# Patient Record
Sex: Female | Born: 1970
Health system: Southern US, Community
[De-identification: ages and names within clinical notes are randomized; demographics above are authoritative.]

## PROBLEM LIST (undated history)

## (undated) DIAGNOSIS — I10 Essential (primary) hypertension: Secondary | ICD-10-CM

## (undated) DIAGNOSIS — O24419 Gestational diabetes mellitus in pregnancy, unspecified control: Secondary | ICD-10-CM

## (undated) HISTORY — DX: Gestational diabetes mellitus in pregnancy, unspecified control: O24.419

## (undated) HISTORY — PX: TUBAL LIGATION: SHX77

---

## 2004-05-16 ENCOUNTER — Emergency Department (HOSPITAL_COMMUNITY): Admission: EM | Admit: 2004-05-16 | Discharge: 2004-05-17 | Payer: Self-pay

## 2005-12-14 ENCOUNTER — Emergency Department (HOSPITAL_COMMUNITY): Admission: EM | Admit: 2005-12-14 | Discharge: 2005-12-14 | Payer: Self-pay | Admitting: Emergency Medicine

## 2005-12-15 ENCOUNTER — Ambulatory Visit: Payer: Self-pay | Admitting: Nurse Practitioner

## 2005-12-15 ENCOUNTER — Ambulatory Visit: Payer: Self-pay | Admitting: *Deleted

## 2005-12-22 ENCOUNTER — Ambulatory Visit: Payer: Self-pay | Admitting: Nurse Practitioner

## 2005-12-26 ENCOUNTER — Ambulatory Visit (HOSPITAL_COMMUNITY): Admission: RE | Admit: 2005-12-26 | Discharge: 2005-12-26 | Payer: Self-pay | Admitting: Family Medicine

## 2005-12-26 ENCOUNTER — Encounter (INDEPENDENT_AMBULATORY_CARE_PROVIDER_SITE_OTHER): Payer: Self-pay | Admitting: *Deleted

## 2006-01-05 ENCOUNTER — Ambulatory Visit: Payer: Self-pay | Admitting: Nurse Practitioner

## 2006-01-05 ENCOUNTER — Ambulatory Visit: Payer: Self-pay | Admitting: Hematology & Oncology

## 2006-07-03 ENCOUNTER — Ambulatory Visit: Payer: Self-pay | Admitting: Nurse Practitioner

## 2006-07-16 ENCOUNTER — Encounter (INDEPENDENT_AMBULATORY_CARE_PROVIDER_SITE_OTHER): Payer: Self-pay | Admitting: Nurse Practitioner

## 2006-07-16 ENCOUNTER — Ambulatory Visit: Payer: Self-pay | Admitting: Nurse Practitioner

## 2006-09-19 ENCOUNTER — Ambulatory Visit: Payer: Self-pay | Admitting: Nurse Practitioner

## 2007-04-17 ENCOUNTER — Encounter (INDEPENDENT_AMBULATORY_CARE_PROVIDER_SITE_OTHER): Payer: Self-pay | Admitting: *Deleted

## 2007-04-23 ENCOUNTER — Ambulatory Visit: Payer: Self-pay | Admitting: Internal Medicine

## 2007-08-01 DIAGNOSIS — O24419 Gestational diabetes mellitus in pregnancy, unspecified control: Secondary | ICD-10-CM

## 2007-08-01 HISTORY — PX: TUBAL LIGATION: SHX77

## 2007-08-01 HISTORY — DX: Gestational diabetes mellitus in pregnancy, unspecified control: O24.419

## 2007-11-07 ENCOUNTER — Emergency Department (HOSPITAL_COMMUNITY): Admission: EM | Admit: 2007-11-07 | Discharge: 2007-11-07 | Payer: Self-pay | Admitting: Emergency Medicine

## 2008-02-14 ENCOUNTER — Ambulatory Visit (HOSPITAL_COMMUNITY): Admission: RE | Admit: 2008-02-14 | Discharge: 2008-02-14 | Payer: Self-pay | Admitting: Gynecology

## 2008-02-18 ENCOUNTER — Ambulatory Visit (HOSPITAL_COMMUNITY): Admission: RE | Admit: 2008-02-18 | Discharge: 2008-02-18 | Payer: Self-pay | Admitting: Family Medicine

## 2008-02-20 ENCOUNTER — Ambulatory Visit: Payer: Self-pay | Admitting: *Deleted

## 2008-02-24 ENCOUNTER — Ambulatory Visit: Payer: Self-pay | Admitting: Obstetrics & Gynecology

## 2008-02-28 ENCOUNTER — Ambulatory Visit (HOSPITAL_COMMUNITY): Admission: RE | Admit: 2008-02-28 | Discharge: 2008-02-28 | Payer: Self-pay | Admitting: Family Medicine

## 2008-03-02 ENCOUNTER — Ambulatory Visit: Payer: Self-pay | Admitting: Family Medicine

## 2008-03-09 ENCOUNTER — Encounter: Admission: RE | Admit: 2008-03-09 | Discharge: 2008-04-21 | Payer: Self-pay | Admitting: Obstetrics & Gynecology

## 2008-03-09 ENCOUNTER — Ambulatory Visit: Payer: Self-pay | Admitting: Obstetrics & Gynecology

## 2008-03-16 ENCOUNTER — Ambulatory Visit (HOSPITAL_COMMUNITY): Admission: RE | Admit: 2008-03-16 | Discharge: 2008-03-16 | Payer: Self-pay | Admitting: Family Medicine

## 2008-03-16 ENCOUNTER — Ambulatory Visit: Payer: Self-pay | Admitting: Obstetrics & Gynecology

## 2008-03-23 ENCOUNTER — Ambulatory Visit: Payer: Self-pay | Admitting: Obstetrics & Gynecology

## 2008-03-30 ENCOUNTER — Ambulatory Visit: Payer: Self-pay | Admitting: Obstetrics & Gynecology

## 2008-03-30 ENCOUNTER — Ambulatory Visit (HOSPITAL_COMMUNITY): Admission: RE | Admit: 2008-03-30 | Discharge: 2008-03-30 | Payer: Self-pay | Admitting: Family Medicine

## 2008-04-13 ENCOUNTER — Ambulatory Visit (HOSPITAL_COMMUNITY): Admission: RE | Admit: 2008-04-13 | Discharge: 2008-04-13 | Payer: Self-pay | Admitting: Family Medicine

## 2008-04-13 ENCOUNTER — Ambulatory Visit: Payer: Self-pay | Admitting: Obstetrics & Gynecology

## 2008-04-20 ENCOUNTER — Ambulatory Visit: Payer: Self-pay | Admitting: Obstetrics & Gynecology

## 2008-04-27 ENCOUNTER — Ambulatory Visit: Payer: Self-pay | Admitting: Family Medicine

## 2008-04-27 ENCOUNTER — Ambulatory Visit (HOSPITAL_COMMUNITY): Admission: RE | Admit: 2008-04-27 | Discharge: 2008-04-27 | Payer: Self-pay | Admitting: Family Medicine

## 2008-05-04 ENCOUNTER — Ambulatory Visit: Payer: Self-pay | Admitting: Obstetrics & Gynecology

## 2008-05-04 ENCOUNTER — Ambulatory Visit (HOSPITAL_COMMUNITY): Admission: RE | Admit: 2008-05-04 | Discharge: 2008-05-04 | Payer: Self-pay | Admitting: Family Medicine

## 2008-05-11 ENCOUNTER — Ambulatory Visit: Payer: Self-pay | Admitting: Obstetrics & Gynecology

## 2008-05-11 ENCOUNTER — Encounter: Admission: RE | Admit: 2008-05-11 | Discharge: 2008-05-11 | Payer: Self-pay | Admitting: Obstetrics & Gynecology

## 2008-05-18 ENCOUNTER — Ambulatory Visit (HOSPITAL_COMMUNITY): Admission: RE | Admit: 2008-05-18 | Discharge: 2008-05-18 | Payer: Self-pay | Admitting: Family Medicine

## 2008-05-18 ENCOUNTER — Ambulatory Visit: Payer: Self-pay | Admitting: Family Medicine

## 2008-05-25 ENCOUNTER — Ambulatory Visit: Payer: Self-pay | Admitting: Family Medicine

## 2008-05-25 ENCOUNTER — Ambulatory Visit: Payer: Self-pay | Admitting: Obstetrics & Gynecology

## 2008-05-25 ENCOUNTER — Encounter: Payer: Self-pay | Admitting: Obstetrics & Gynecology

## 2008-05-25 ENCOUNTER — Inpatient Hospital Stay (HOSPITAL_COMMUNITY): Admission: AD | Admit: 2008-05-25 | Discharge: 2008-05-27 | Payer: Self-pay | Admitting: Family Medicine

## 2008-05-25 LAB — CONVERTED CEMR LAB
HCT: 33 % — ABNORMAL LOW (ref 36.0–46.0)
Platelets: 145 10*3/uL — ABNORMAL LOW (ref 150–400)
RDW: 13.6 % (ref 11.5–15.5)
WBC: 8.4 10*3/uL (ref 4.0–10.5)

## 2008-06-01 ENCOUNTER — Ambulatory Visit: Payer: Self-pay | Admitting: Obstetrics & Gynecology

## 2008-06-01 ENCOUNTER — Encounter: Payer: Self-pay | Admitting: *Deleted

## 2008-06-01 ENCOUNTER — Inpatient Hospital Stay (HOSPITAL_COMMUNITY): Admission: AD | Admit: 2008-06-01 | Discharge: 2008-06-05 | Payer: Self-pay | Admitting: Family Medicine

## 2008-06-02 ENCOUNTER — Encounter: Payer: Self-pay | Admitting: *Deleted

## 2008-06-02 ENCOUNTER — Encounter: Payer: Self-pay | Admitting: Obstetrics & Gynecology

## 2009-08-27 ENCOUNTER — Emergency Department (HOSPITAL_COMMUNITY): Admission: EM | Admit: 2009-08-27 | Discharge: 2009-08-27 | Payer: Self-pay | Admitting: Emergency Medicine

## 2009-10-03 ENCOUNTER — Emergency Department (HOSPITAL_COMMUNITY): Admission: EM | Admit: 2009-10-03 | Discharge: 2009-10-03 | Payer: Self-pay | Admitting: Emergency Medicine

## 2009-11-19 ENCOUNTER — Encounter: Admission: RE | Admit: 2009-11-19 | Discharge: 2009-11-19 | Payer: Self-pay | Admitting: Family Medicine

## 2010-06-19 IMAGING — US US OB DETAIL EACH ADDL GEST + 14 WK
1 series · 14 of 28 positions shown · non-contrast
Comparison: none

OBSTETRICAL ULTRASOUND:
 This ultrasound was performed in The [HOSPITAL], and the [facility] report will be stored to [REDACTED] PACS.

[Series 1: us ob detail each addl gest + 14 wk · 14 of 199 slices shown]
[im 8/199]
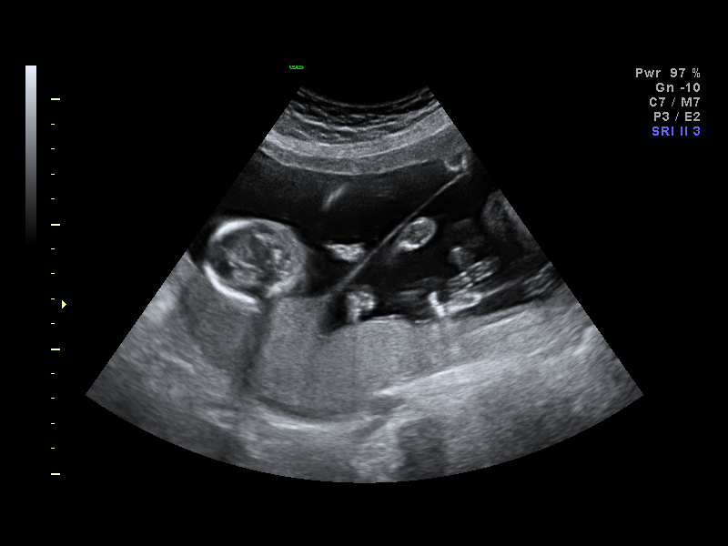
[im 23/199]
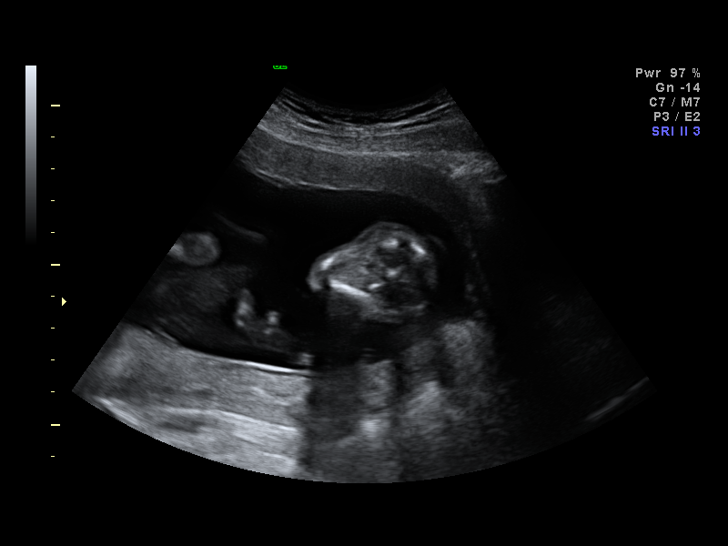
[im 37/199]
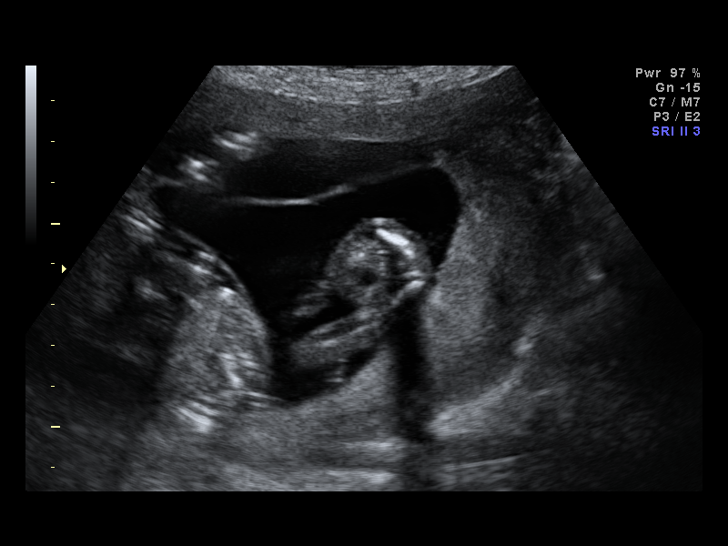
[im 52/199]
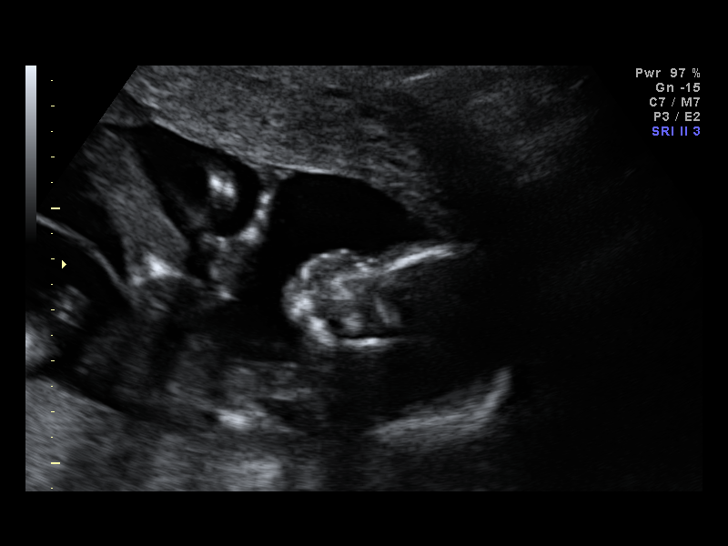
[im 67/199]
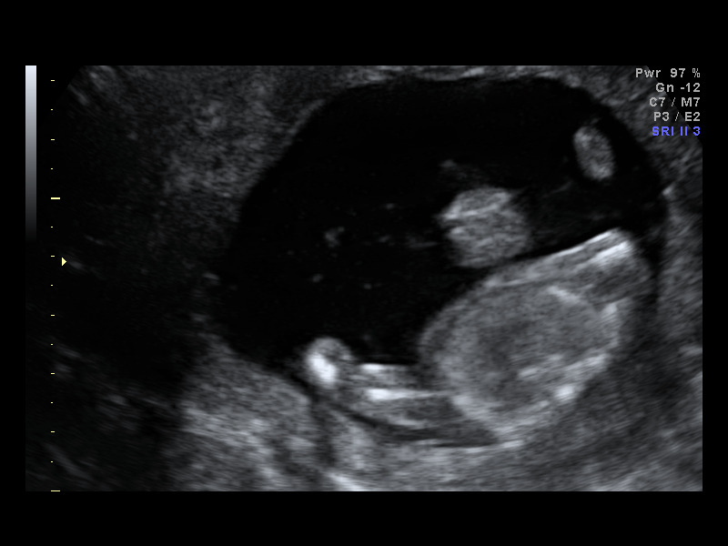
[im 81/199]
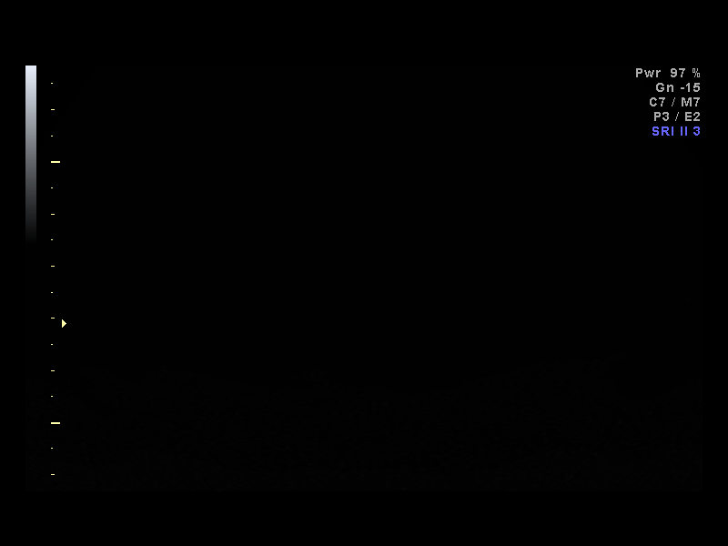
[im 96/199]
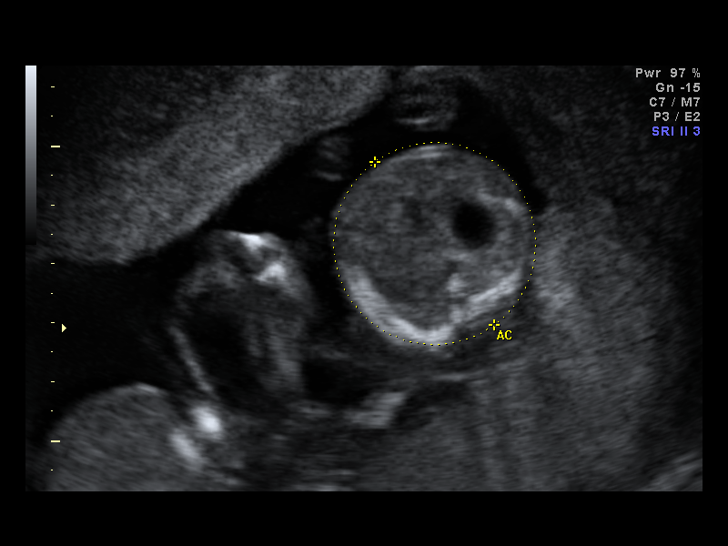
[im 111/199]
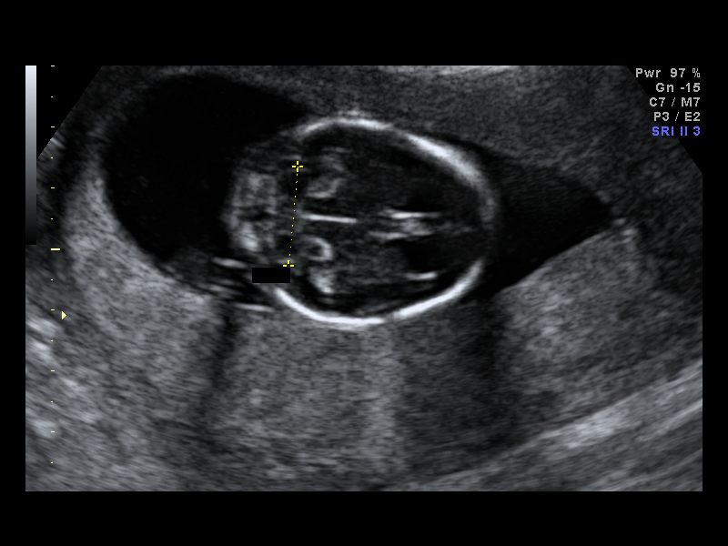
[im 125/199]
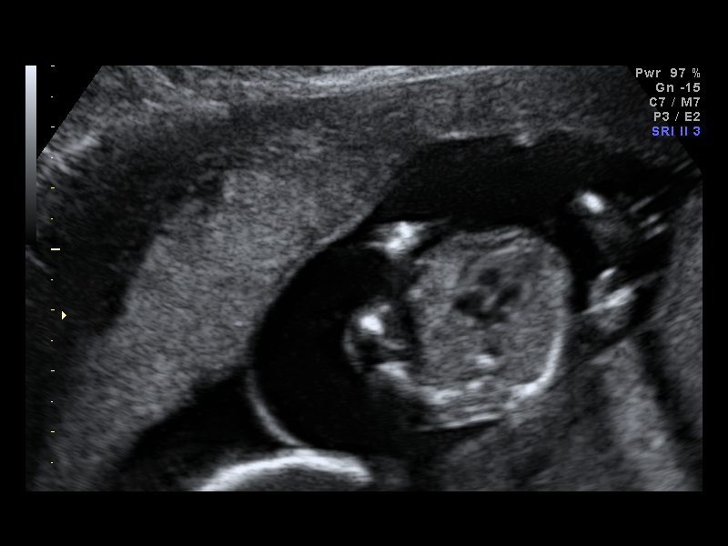
[im 140/199]
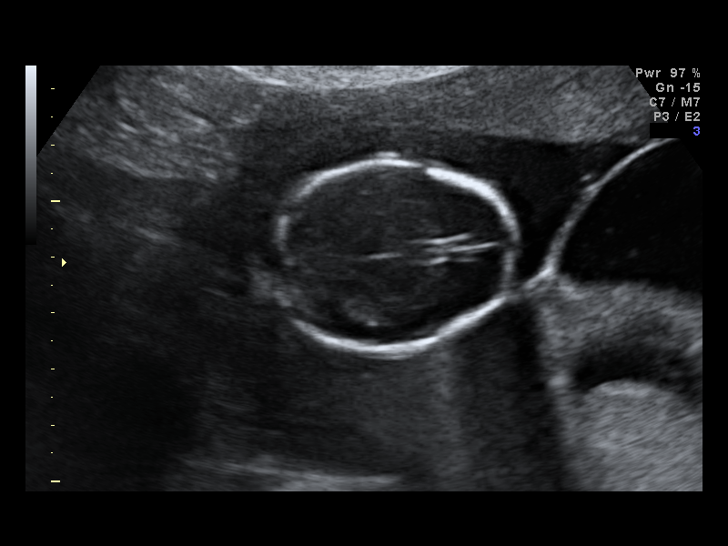
[im 155/199]
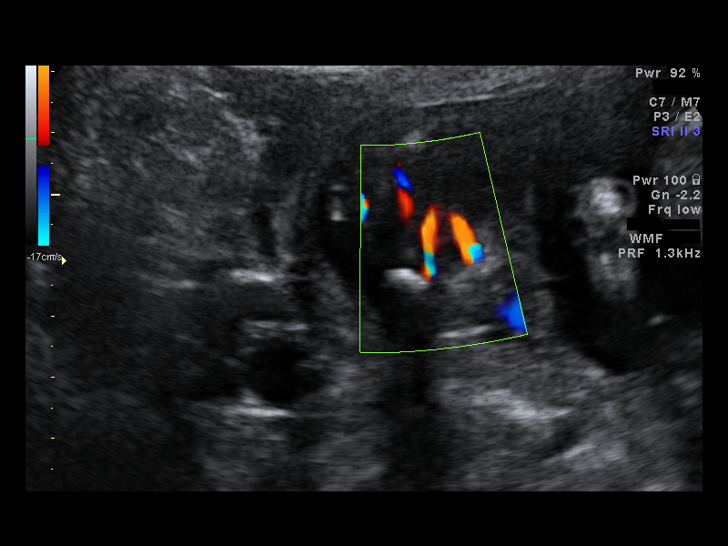
[im 169/199]
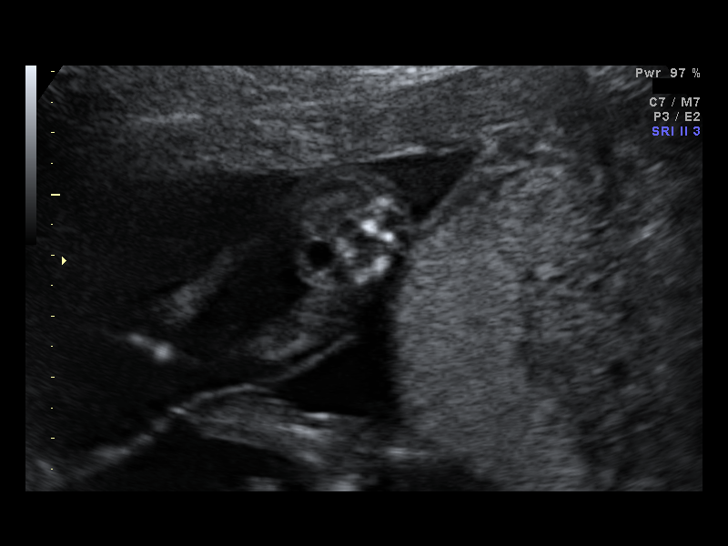
[im 184/199]
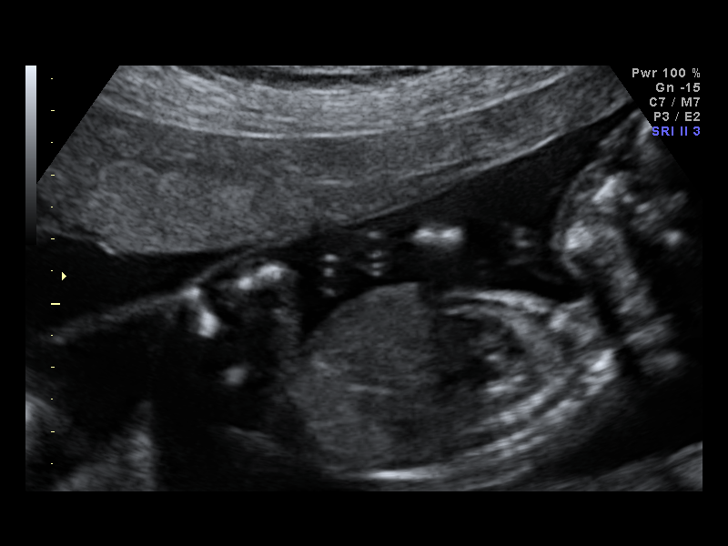
[im 199/199]
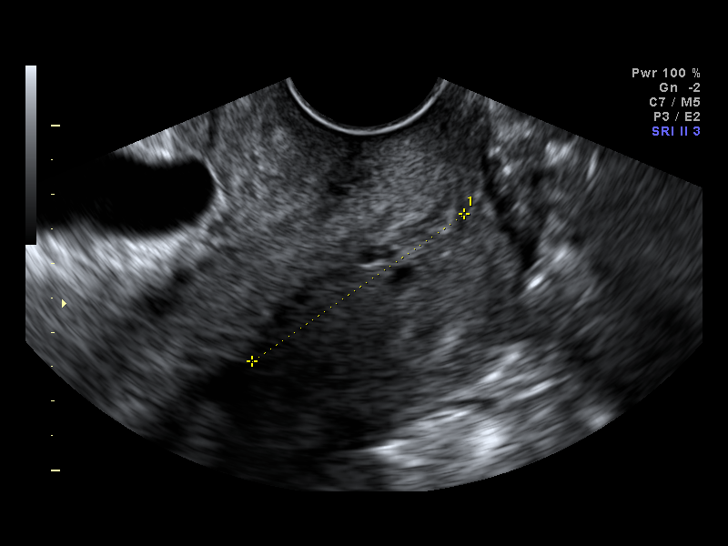

[14 of 28 positions shown; findings below may reference images not displayed]

IMPRESSION: [facility] has also been faxed to the ordering physician.

## 2010-07-06 IMAGING — US US OB FOLLOW-UP EACH ADDL GEST (MODIFY)
1 series · 14 of 28 positions shown · non-contrast
Comparison: none

OBSTETRICAL ULTRASOUND:
 This ultrasound was performed in The [facility]OS[facility]], and the [facility] report will be stored to [REDACTED] PACS.

[Series 1: us ob follow-up each addl gest (modify) · 14 of 166 slices shown]
[im 7/166]
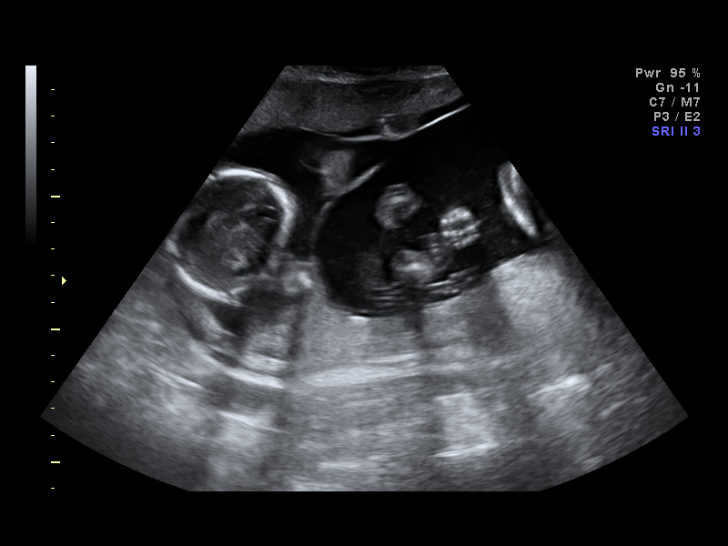
[im 19/166]
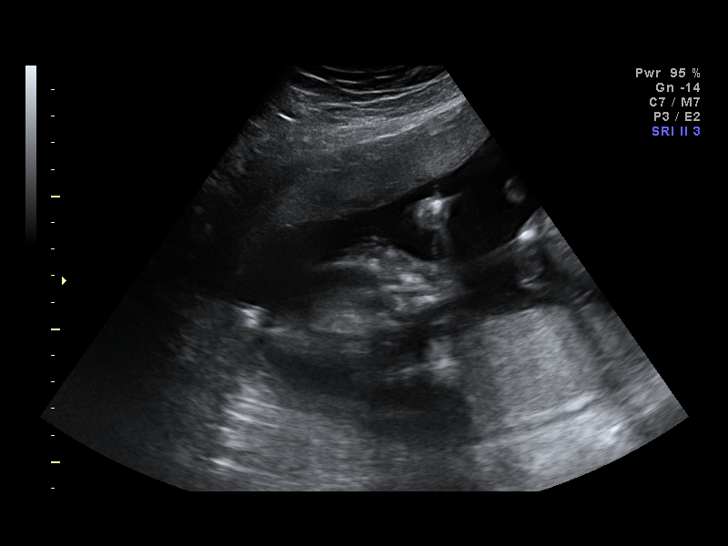
[im 31/166]
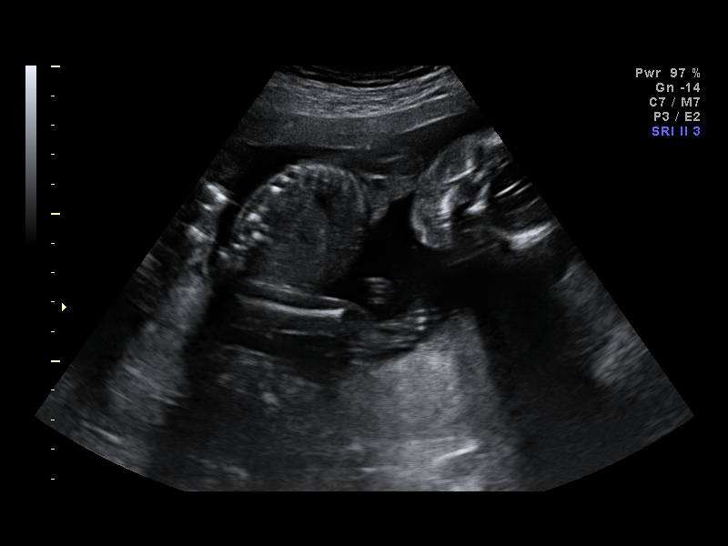
[im 43/166]
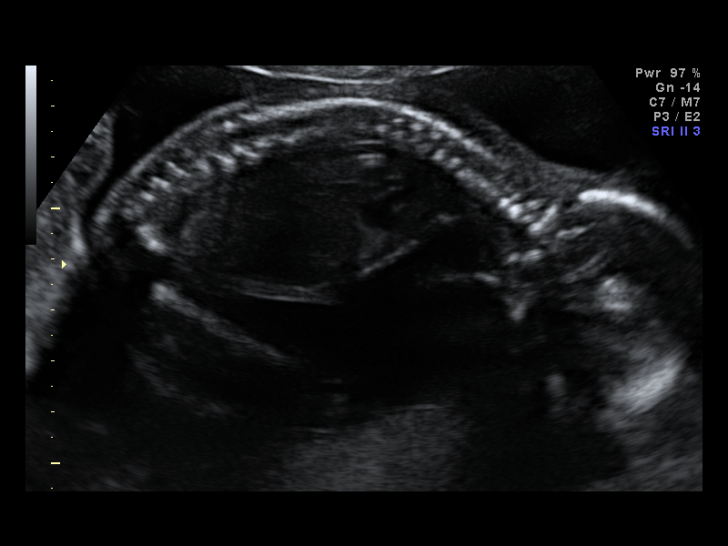
[im 56/166]
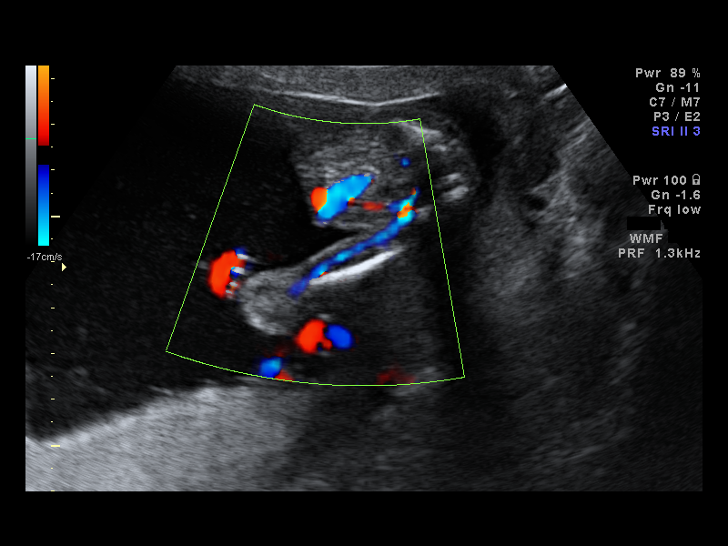
[im 68/166]
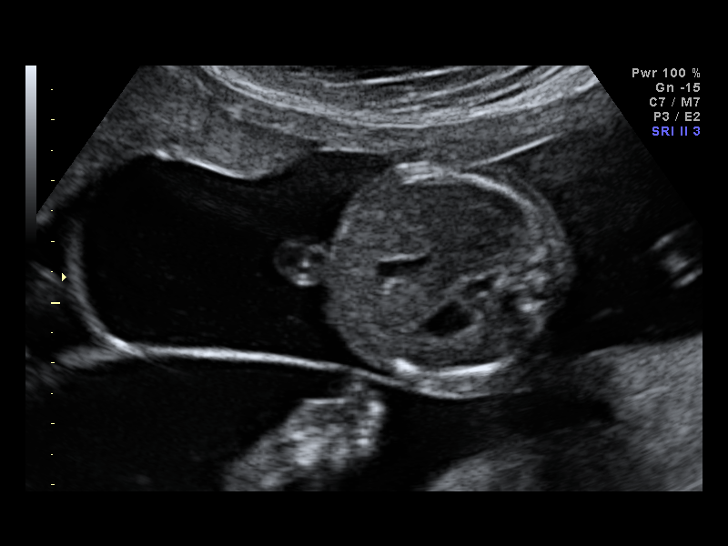
[im 80/166]
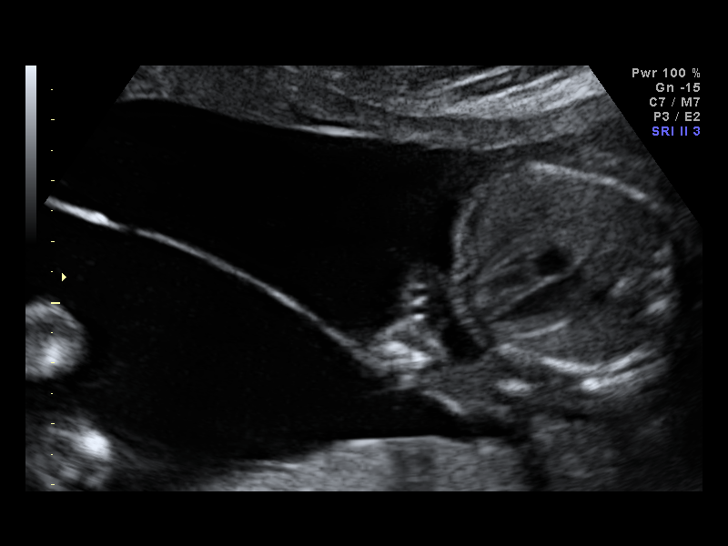
[im 92/166]
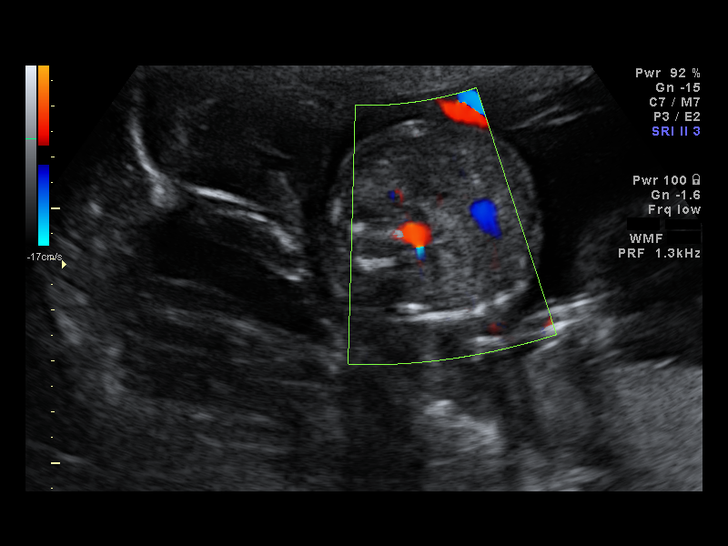
[im 104/166]
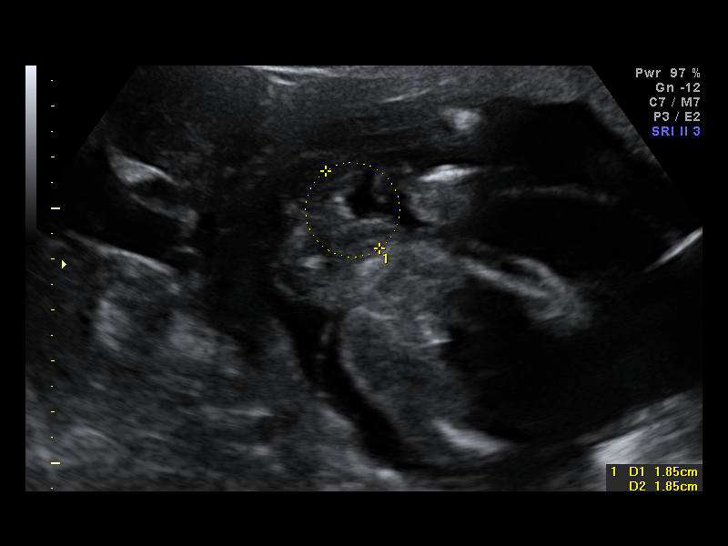
[im 117/166]
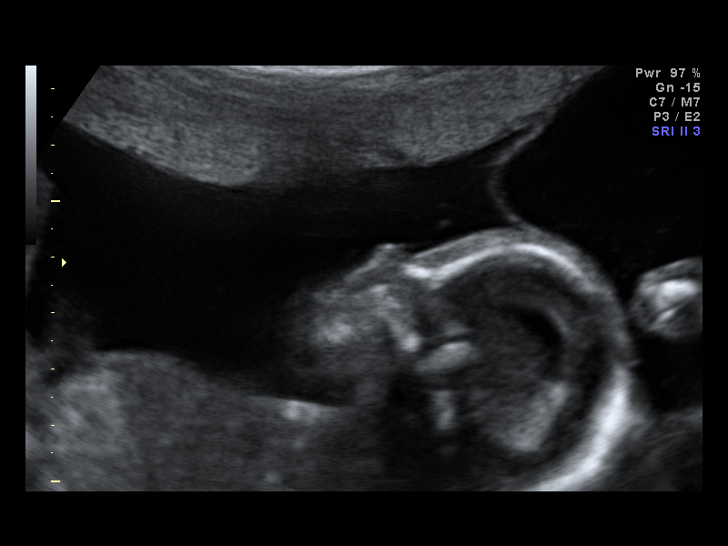
[im 129/166]
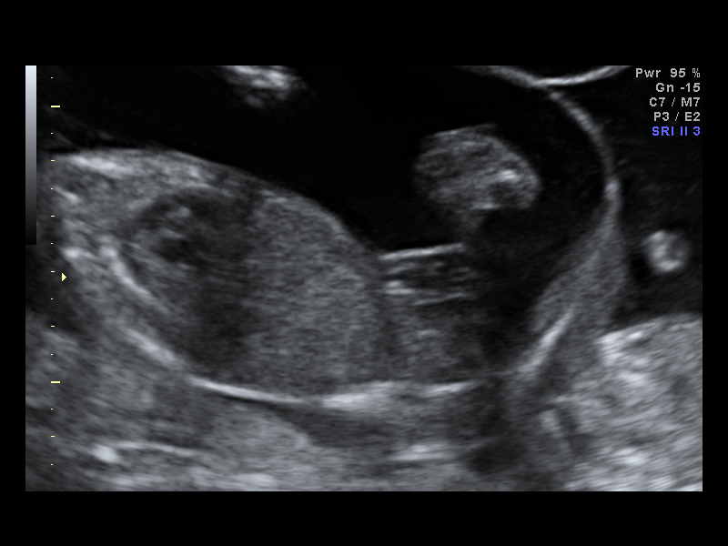
[im 141/166]
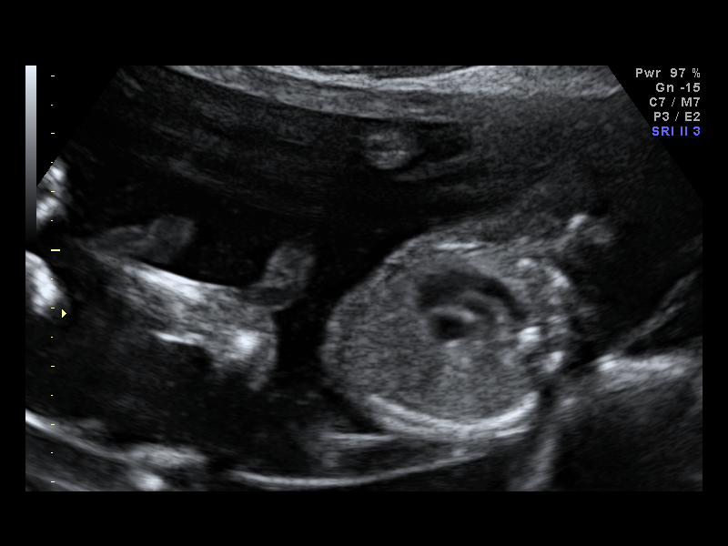
[im 153/166]
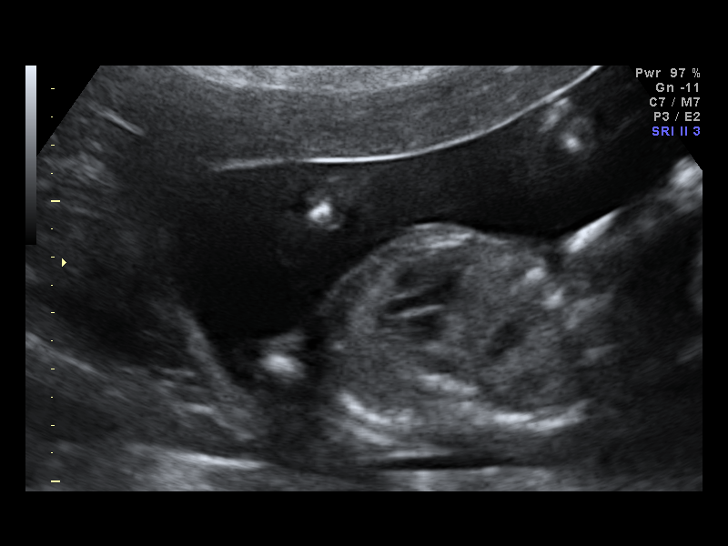
[im 166/166]
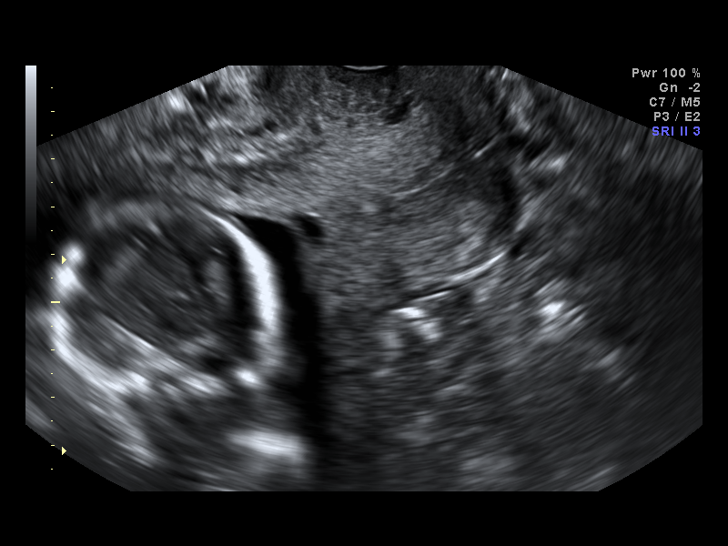

[14 of 28 positions shown; findings below may reference images not displayed]

IMPRESSION: [facility] has also been faxed to the ordering physician.

## 2010-08-03 IMAGING — US US OB FOLLOW-UP
1 series · 14 of 28 positions shown · non-contrast
Comparison: none

OBSTETRICAL ULTRASOUND:
 This ultrasound was performed in [facility]], and the [facility] report will be stored to [REDACTED] PACS.

[Series 1: us ob follow-up · 14 of 98 slices shown]
[im 4/98]
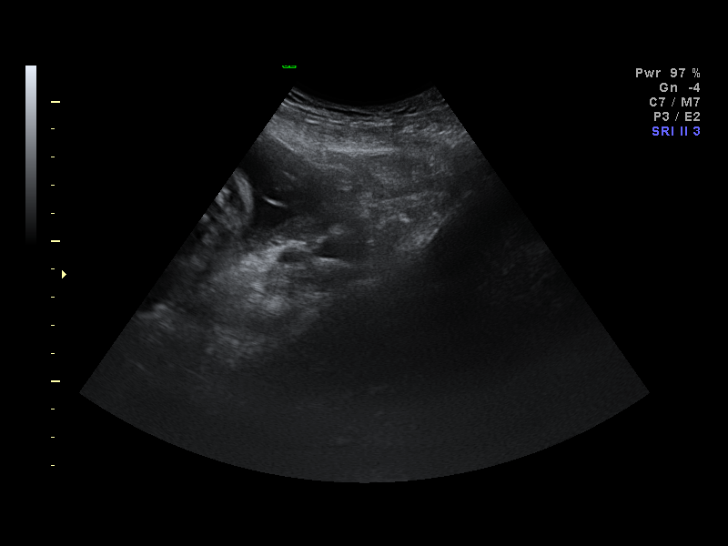
[im 11/98]
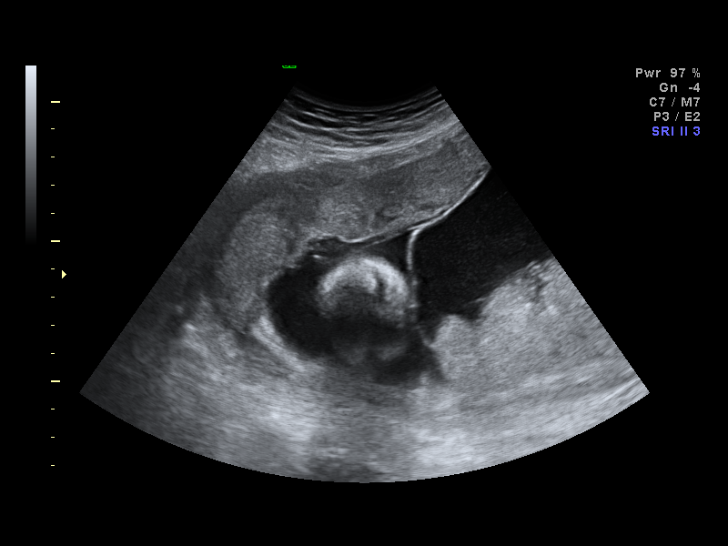
[im 18/98]
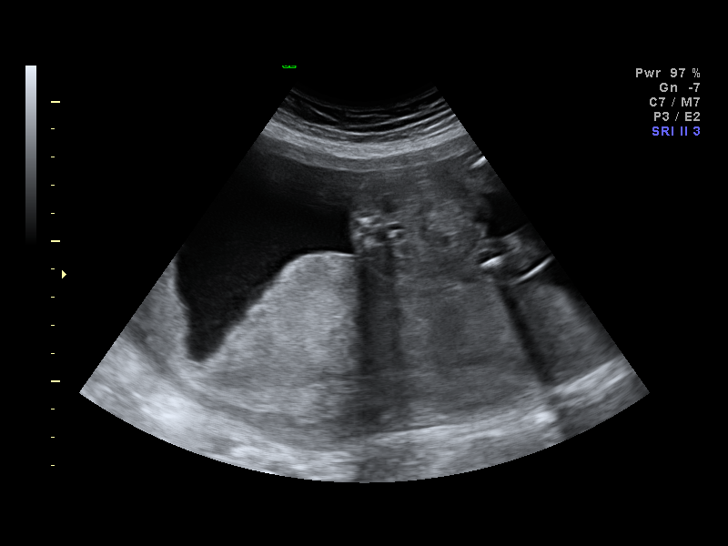
[im 26/98]
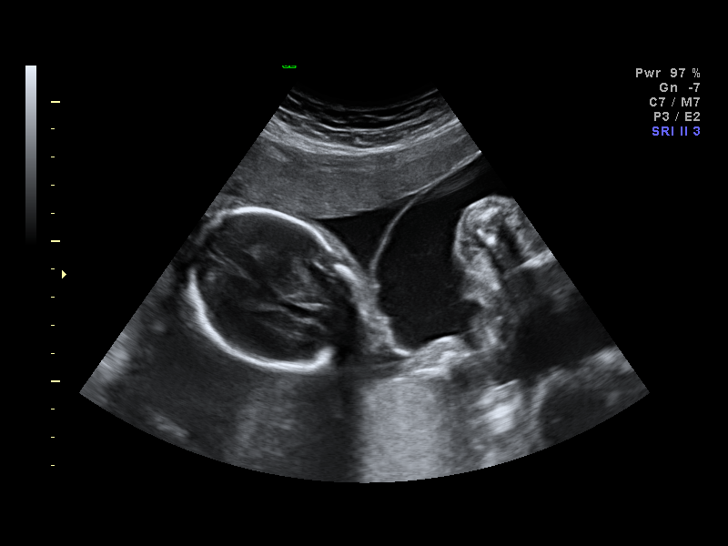
[im 33/98]
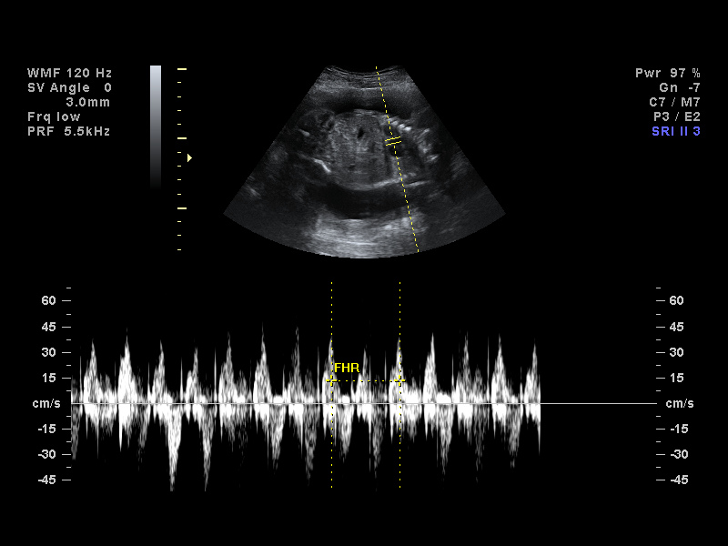
[im 40/98]
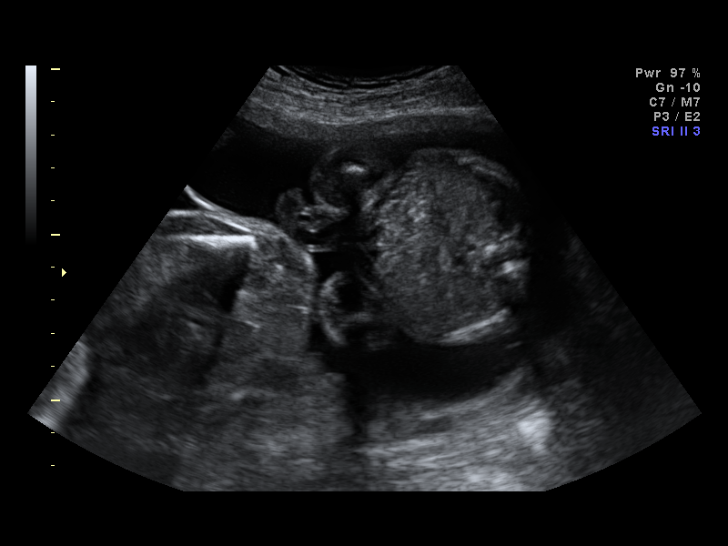
[im 47/98]
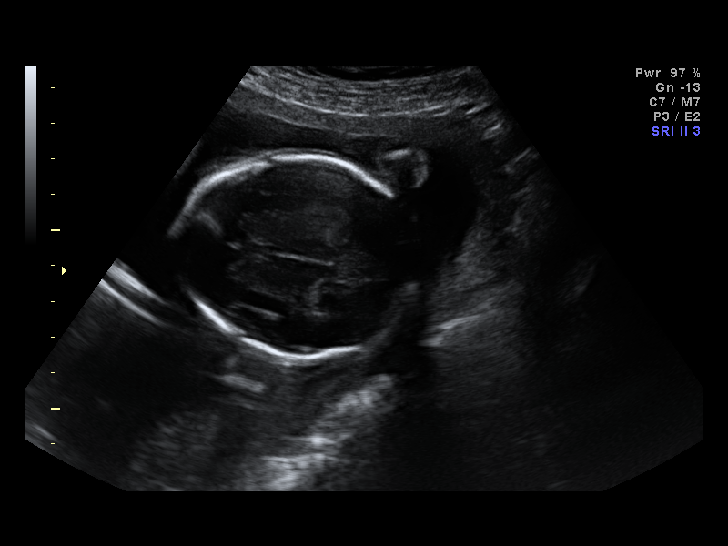
[im 54/98]
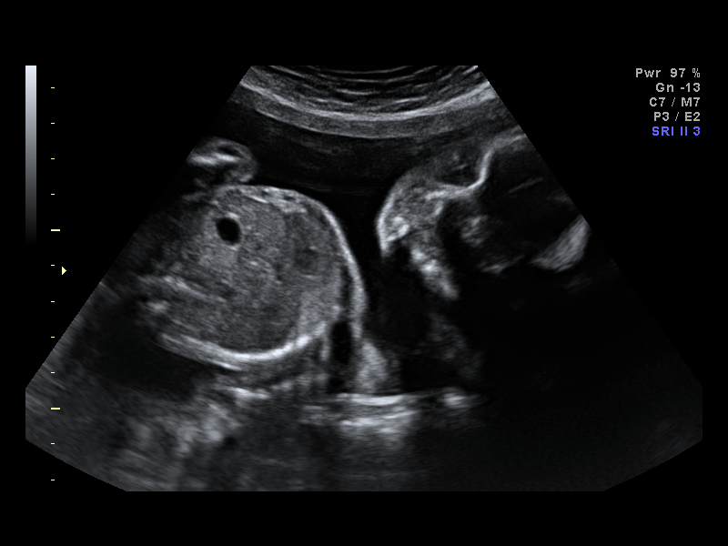
[im 62/98]
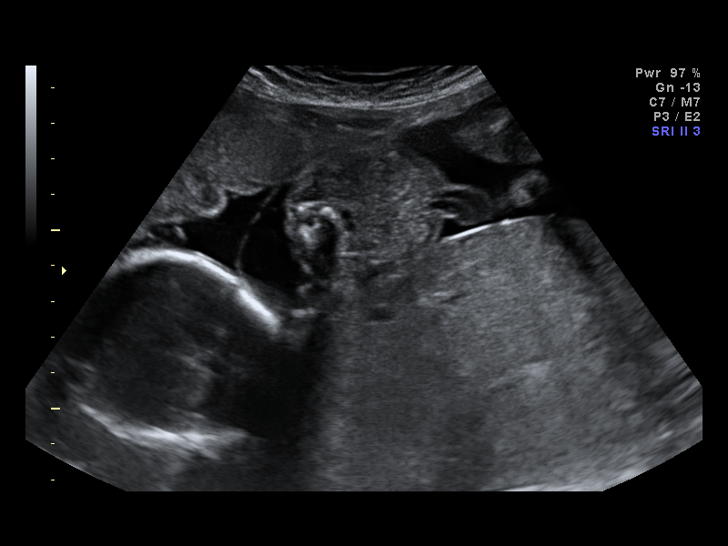
[im 69/98]
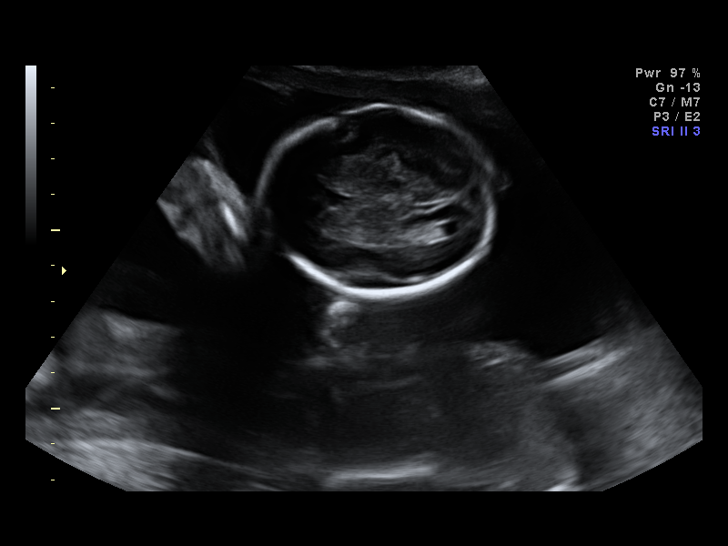
[im 76/98]
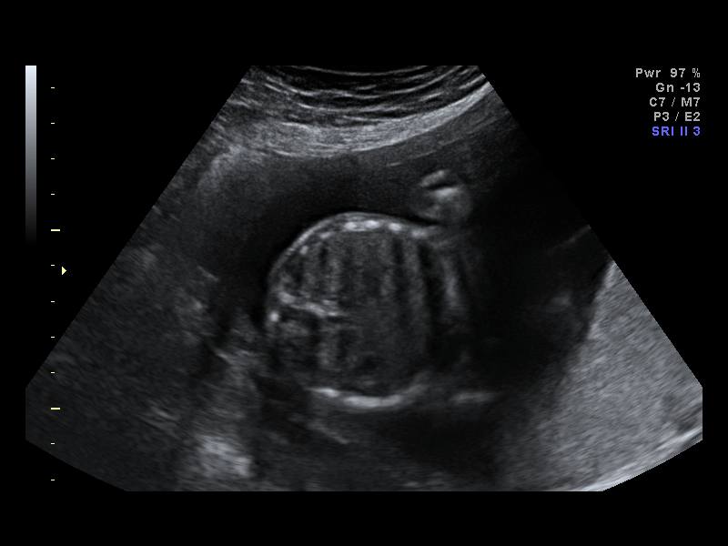
[im 83/98]
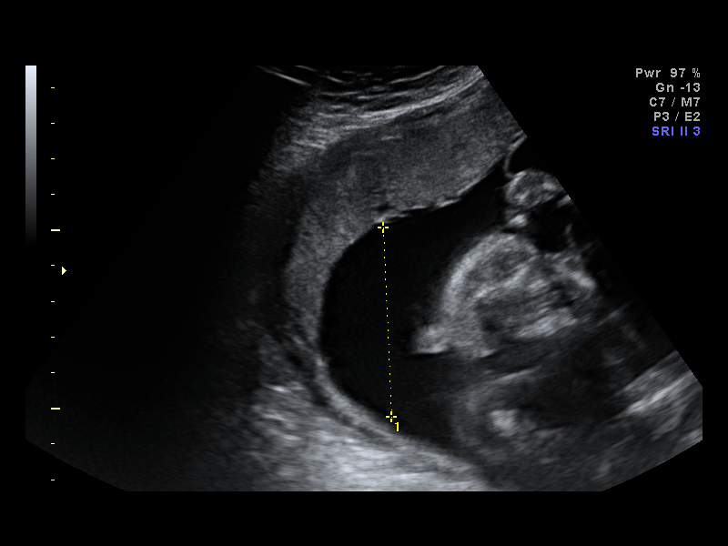
[im 90/98]
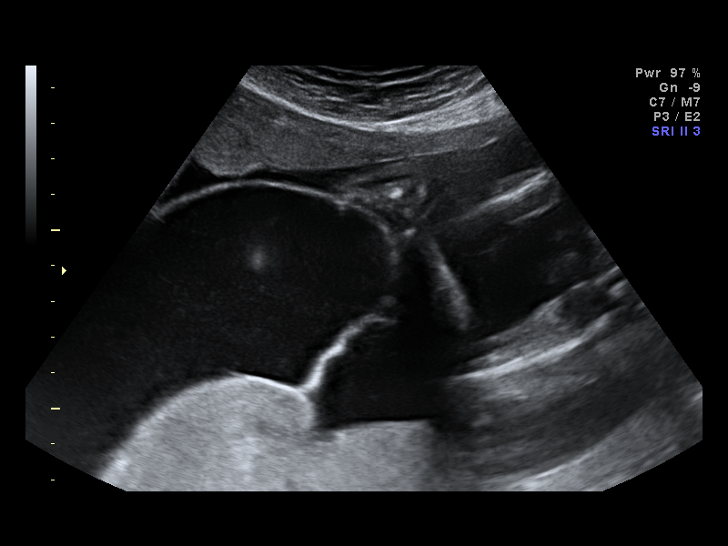
[im 98/98]
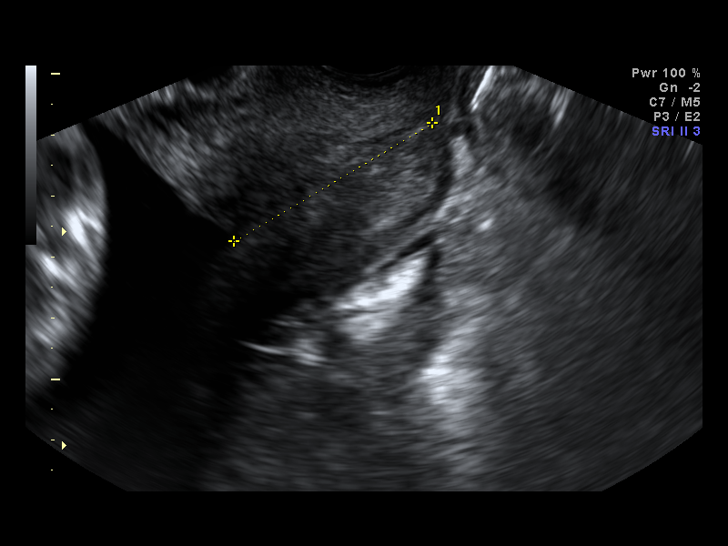

[14 of 28 positions shown; findings below may reference images not displayed]

IMPRESSION: [facility] has also been faxed to the ordering physician.

## 2010-08-21 ENCOUNTER — Encounter: Payer: Self-pay | Admitting: *Deleted

## 2010-10-24 LAB — BASIC METABOLIC PANEL
CO2: 27 mEq/L (ref 19–32)
Calcium: 9.1 mg/dL (ref 8.4–10.5)
Creatinine, Ser: 0.71 mg/dL (ref 0.4–1.2)
GFR calc Af Amer: >60 mL/min (ref >60)
GFR calc non Af Amer: >60 mL/min (ref >60)
Glucose, Bld: 121 mg/dL — ABNORMAL HIGH (ref 70–99)
Sodium: 137 mEq/L (ref 135–145)

## 2010-10-24 LAB — DIFFERENTIAL
Basophils Absolute: 0 10*3/uL (ref 0.0–0.1)
Eosinophils Absolute: 0 10*3/uL (ref 0.0–0.7)
Lymphocytes Relative: 29 % (ref 12–46)
Monocytes Relative: 7 % (ref 3–12)
Neutro Abs: 2.8 10*3/uL (ref 1.7–7.7)
Neutrophils Relative %: 63 % (ref 43–77)

## 2010-10-24 LAB — CBC
Hemoglobin: 12.9 g/dL (ref 12.0–15.0)
MCHC: 34.1 g/dL (ref 30.0–36.0)
RBC: 4.28 MIL/uL (ref 3.87–5.11)
RDW: 13.6 % (ref 11.5–15.5)

## 2010-12-13 NOTE — Op Note (Signed)
Carrie Greer, WALDER         ACCOUNT NO.:  [id number]   MEDICAL RECORD NO.:  [id number]          PATIENT TYPE:  INP   LOCATION:                                FAC[facility]:  [facility]   PHYSICIAN:  Lesly Dukes, M.D. DATE OF BIRTH:  06/04/1971   DATE OF PROCEDURE:  06/02/2008  DATE OF DISCHARGE:                               OPERATIVE REPORT   PREOPERATIVE DIAGNOSES:  30. A 40 year old para 3-2-1-4 at 29 weeks and 5 days estimated      gestational age with reverse end-diastolic flow of baby B of her      set of triplets.  2. Undesired fertility.   POSTOPERATIVE DIAGNOSES:  60. A 40 year old para 3-2-1-4 at 29 weeks and 5 days estimated      gestational age with reverse end-diastolic flow of baby B of her      set of triplets.  2. Undesired fertility.   PROCEDURE:  Primary low-transverse cesarean section plus bilateral tubal  ligation with Filshie clips.   SURGEON:  Lesly Dukes, MD   ASSISTANT:  Odie Sera, DO   ANESTHESIA:  Spinal.   FINDINGS:  Grossly normal uterus, ovaries, and fallopian tubes  bilaterally.  One large adhesion of the peritoneum bladder flap to the  anterior portion of the uterus.  Baby A was breech, 2 pounds 14 ounces,  Apgars 8 at one minute and 9 at five minutes, and arterial cord gas was  7.35.  Baby B was born breech, 1 pound 14 ounces, 6 at one minute and 8  at five minutes, arterial cord gas was 7.29.  Baby C was 2 pounds 9  ounces, Apgars 9 at one minute and 9 at five minutes with arterial cord  gas of 7.35.   SPECIMENS:  Placenta to pathology.   ESTIMATED BLOOD LOSS:  1000.   COMPLICATIONS:  Mild atony relieved with Cytotec and Pitocin.   PROCEDURE:  After informed consent was obtained, the patient taken to  the operating room where spinal anesthesia was found to be adequate.  The patient was placed in dorsal supine position with leftward tilt and  a Foley was placed in the bladder.  Pfannenstiel skin incision was made  with the  scalpel and carried down to the fascia.  The fascia was incised  in the midline.  This incision was extended bilaterally with the Mayo  scissors.  The superior and inferior aspects of the fascial incision  were grasped with a Kocher clamp, tented up, and dissected off sharply  and bluntly.  The underlying layers of rectus muscles were separated in  midline.  The peritoneum was entered sharply with this incision extended  with good visualization of bladder.  The one large adhesion that was  described above was taken down with the Bovie.  There was good  hemostasis.  An Alexis retractor was placed into the abdomen and the  bladder flap was then created.  The bladder blade was inserted.  The  uterine incision was made in a transverse fashion in the lower uterine  segment.  This incision was extended bilaterally and cephaledly bluntly.  Sac of baby  A was ruptured and the baby A was delivered breech with the  above findings.  The sac of baby B was then ruptured and delivered  breech with the above findings.  Baby C sac was then ruptured.  Baby was  delivered breech with the above findings.  The cord gases and cord blood  were sent in all three cords.  The placenta was delivered manually and  sent to pathology.  The uterus was clear of all clots and debris, and  there was noted to be some atony.  Pitocin was started immediately;  however, the atony was still significant enough to require Cytotec per  rectum.  This was placed by one of the OR nurses.  The uterine incision  was then closed with 0 Vicryl in a running locked fashion with good  hemostasis.  A second suture of 0 Vicryl was used to reinforce the  incision and aid in hemostasis.  The uterine tone was good at this  point.  The tubal was then done.  Each fallopian tube was followed out  to its fimbriated end and a Filshie clip was placed transversely across  each fallopian tube.  Uterine hemostasis was assured one last time.  The   peritoneum and rectus muscles were noted to be hemostatic.  The fascia  was closed with 0 Vicryl in a running fashion with good hemostasis.  The  subcutaneous tissue was copiously irrigated and found to be hemostatic.  The skin was closed with staples.  The patient tolerated the procedure  well.  Sponge, lap, instrument, and needle count were correct x2 and the  patient recovered in stable condition.      Lesly Dukes, M.D.  Electronically Signed     KHL/MEDQ  D:  06/02/2008  T:  06/03/2008  Job:  [id number]

## 2010-12-13 NOTE — Discharge Summary (Signed)
Carrie Greer, Carrie Greer         ACCOUNT NO.:  [id number]   MEDICAL RECORD NO.:  [id number]         PATIENT TYPE:  WINP   LOCATION:                                [facility]   PHYSICIAN:  Norton Blizzard, MD    DATE OF BIRTH:  [birth year]   DATE OF ADMISSION:  06/01/2008  DATE OF DISCHARGE:                               DISCHARGE SUMMARY   ADMISSION DIAGNOSES:  Triplet gestation, 29-4/7th weeks', chronic  hypertension, pregestational diabetes, probable class C, fetal growth  lag in baby B, persistent absent and end-diastolic flow in baby B.   DISCHARGE DIAGNOSES:  Primary low transverse cervical section with  bilateral tubal ligation with Filshie clip type, viable preterm  triplets, 29-5/7th weeks' gestation, fetal growth lag, and first end-  diastolic flow, chronic hypertension, pregestational diabetes, mild  uterine atony, and advanced maternal age.   REASON FOR ADMISSION:  Please see written H and P.  Essentially the  patient was sent for further evaluation after being seen at maternal  fetal medicine and baby B was noted to have absent end-diastolic flow on  Doppler testing.   HOSPITAL COURSE:  The patient remained normotensive on her labetalol.  Her CBGs were in fair control on first dose insulin.  Of note, she  received her betamethasone on previous admission.  Her daily Dopplers  and fetal monitoring were reassuring.  On first day that on June 02, 2008 in the evening, she was noted to have reversal of end-diastolic  flow for twin B.  In consultation with Dr. Penne Lash and maternal fetal  medicine, the decision was made to proceed with primary LTCS.  This was  essentially uncomplicated; both fetuses were viable with desirable range  cord pH.  She did have some mild atony which was managed with Cytotec.  Her postoperative course went well.  On postop day #3, she remained  afebrile.  Staples were to be removed on June 08, 2008.  Babies were  in the NICU.  Her blood  sugars were well-controlled on glyburide  postoperatively.  Her BPs were in the 120s/70s on labetalol 200 b.i.d.  On June 05, 2008, she was discharged home in good condition.   MEDICATIONS:  Prenatal vitamins as well as iron, since her hemoglobin  postoperatively was 9.5.  She was given prescriptions for Percocet 5/325  mg 1-2 p.o. q.4-6 h. p.r.n. for pain, Colace 100 mg daily p.r.n.,  ibuprofen 600 mg p.o. q.6 h.  Followup other than Baby Love check on  June 08, 2008 was to be at 6 weeks at [facility].  At that time,  her hypertension and diabetes would be reevaluated.  At the time of  discharge, she has also sent home on glyburide 5 mg p.o. daily and  labetalol 200 mg p.o. b.i.d.      Deirdre Christy Gentles, C.N.M.      Norton Blizzard, MD  Electronically Signed    DP/MEDQ  D:  07/28/2008  T:  07/29/2008  Job:  [id number]

## 2010-12-13 NOTE — Discharge Summary (Signed)
Carrie Greer, Carrie Greer         ACCOUNT NO.:  [id number]   MEDICAL RECORD NO.:  [id number]          PATIENT TYPE:  INP   LOCATION:  9159                          [facility]ACILITY:  [facility]   PHYSICIAN:  Tanya S. Shawnie Pons, M.D.   DATE OF BIRTH:  06-Jun-1971   DATE OF ADMISSION:  05/25/2008  DATE OF DISCHARGE:  05/27/2008                               DISCHARGE SUMMARY   REASON FOR ADMISSION:  [name] is a 40 year old, gravida 7, para  4-1-1-4, with a triplet gestation who was admitted at 28-4/7th weeks'  gestational age with preterm contractions and decreased cervical length.  The patient was seen in the [facility] and had an ultrasound  earlier that day that showed a shortened cervical length of 2.7.  She  was admitted for monitoring of corticosteroids as well as Dulcolax.   HOSPITAL COURSE:  When the patient arrived on the ward, she was noted to  be contracting approximately 4 times per hour.  The patient felt the  majority of those contractions, rated them as mild.  She was started on  Procardia 60 mg twice a day which over a period of 24 hours helped  decrease her retractions.  She also received 2 doses of betamethasone  during her hospitalization.  Her contraction pattern essentially ceased  during her hospitalization, and the baby's heart rates remained  reassuring during each examination.  Her other prenatal complications  include chronic hypertension which remained on stable her  hospitalization as well as insulin-dependant diabetes mellitus which of  course was worsened somewhat by her betamethasone shots.  We have  increased her insulin a little bit to help control this.  Otherwise, the  patient is in good condition.  At the time of discharge, she is not  feeling any contractions.  She denies any headaches or abdominal pain.  She had some occasional contraction on the monitor.  Again, the patient  is not sensing those at all.   FINAL DIAGNOSES:  1. Triplet gestation at  28-4/7th weeks.  2. Short cervix.  3. Preterm contractions.  4. Chronic hypertension.  5. Insulin-dependant diabetes mellitus.   PROCEDURE PERFORMED:  None.   DISCHARGE INSTRUCTIONS:  The patient will be given a prescription for  Procardia XL 60 mg twice a day.  Additionally, her insulin will be  changed to 14 units of regular and 24 units of NPH every morning, 18  units of regular before dinner and 15 units NPH before bedtime.  The  patient will continue to take her labetalol 200 mg twice  daily and her prenatal vitamins as well.  The patient voiced  understanding of these instructions, and her questions were answered.  She has a followup appointment in the [facility] as well as with  Maternal Fetal Medicine on Monday, May 31, 2008.       Odie Sera, DO  Electronically Signed     ______________________________  Shelbie Proctor. Shawnie Pons, M.D.    MC/MEDQ  D:  05/27/2008  T:  05/27/2008  Job:  [id number]

## 2010-12-15 ENCOUNTER — Other Ambulatory Visit: Payer: Self-pay | Admitting: Family Medicine

## 2010-12-15 DIAGNOSIS — Z1231 Encounter for screening mammogram for malignant neoplasm of breast: Secondary | ICD-10-CM

## 2010-12-22 ENCOUNTER — Ambulatory Visit
Admission: RE | Admit: 2010-12-22 | Discharge: 2010-12-22 | Disposition: A | Discharge status: Home / Self Care | Payer: Managed Care, Other (non HMO) | Source: Ambulatory Visit | Attending: Family Medicine | Admitting: Family Medicine

## 2010-12-22 DIAGNOSIS — Z1231 Encounter for screening mammogram for malignant neoplasm of breast: Secondary | ICD-10-CM

## 2011-04-28 LAB — POCT URINALYSIS DIP (DEVICE)
Bilirubin Urine: NEGATIVE
Bilirubin Urine: NEGATIVE
Bilirubin Urine: NEGATIVE
Glucose, UA: 500 — AB
Glucose, UA: 500 — AB
Glucose, UA: 100 — AB
Glucose, UA: NEGATIVE
Hgb urine dipstick: NEGATIVE
Hgb urine dipstick: NEGATIVE
Nitrite: NEGATIVE
Nitrite: NEGATIVE
Nitrite: NEGATIVE
Nitrite: NEGATIVE
Operator id: 148111
Operator id: 297281
Operator id: 194561
Operator id: 297281
Urobilinogen, UA: 0.2
Urobilinogen, UA: 0.2
Urobilinogen, UA: 0.2
Urobilinogen, UA: 0.2

## 2011-05-01 LAB — POCT URINALYSIS DIP (DEVICE)
Glucose, UA: NEGATIVE
Glucose, UA: NEGATIVE
Hgb urine dipstick: NEGATIVE
Hgb urine dipstick: NEGATIVE
Nitrite: NEGATIVE
Nitrite: NEGATIVE
Urobilinogen, UA: 0.2
Urobilinogen, UA: 0.2
pH: 6
pH: 6.5

## 2011-05-02 LAB — GLUCOSE, CAPILLARY
Glucose-Capillary: 132 — ABNORMAL HIGH
Glucose-Capillary: 133 — ABNORMAL HIGH
Glucose-Capillary: 139 — ABNORMAL HIGH
Glucose-Capillary: 144 — ABNORMAL HIGH
Glucose-Capillary: 150 — ABNORMAL HIGH
Glucose-Capillary: 173 — ABNORMAL HIGH
Glucose-Capillary: 185 — ABNORMAL HIGH
Glucose-Capillary: 38 — CL
Glucose-Capillary: 112 — ABNORMAL HIGH
Glucose-Capillary: 131 — ABNORMAL HIGH
Glucose-Capillary: 137 — ABNORMAL HIGH
Glucose-Capillary: 61 — ABNORMAL LOW
Glucose-Capillary: 81
Glucose-Capillary: 84
Glucose-Capillary: 90
Glucose-Capillary: 92

## 2011-05-02 LAB — POCT URINALYSIS DIP (DEVICE)
Glucose, UA: NEGATIVE
Glucose, UA: NEGATIVE
Glucose, UA: NEGATIVE
Hgb urine dipstick: NEGATIVE
Hgb urine dipstick: NEGATIVE
Hgb urine dipstick: NEGATIVE
Ketones, ur: NEGATIVE
Nitrite: NEGATIVE
Nitrite: NEGATIVE
Nitrite: NEGATIVE
Nitrite: NEGATIVE
Operator id: 287931
Operator id: 297281
Operator id: 148111
Protein, ur: 30 — AB
Protein, ur: NEGATIVE
Protein, ur: NEGATIVE
Specific Gravity, Urine: 1.015
Specific Gravity, Urine: 1.015
Urobilinogen, UA: 0.2
Urobilinogen, UA: 0.2
Urobilinogen, UA: 0.2
Urobilinogen, UA: 0.2
Urobilinogen, UA: 0.2
pH: 6
pH: 6

## 2011-05-02 LAB — URIC ACID: Uric Acid, Serum: 4.3

## 2011-05-02 LAB — COMPREHENSIVE METABOLIC PANEL
BUN: 5 — ABNORMAL LOW
CO2: 25
Calcium: 9.4
Chloride: 104
Creatinine, Ser: 0.56
GFR calc non Af Amer: >60
Glucose, Bld: 141 — ABNORMAL HIGH
Total Bilirubin: 0.6

## 2011-05-02 LAB — CBC
HCT: 33.7 — ABNORMAL LOW
Hemoglobin: 11.3 — ABNORMAL LOW
MCHC: 33.3
MCHC: 33.7
MCV: 93.4
MCV: 93.8
RBC: 3.59 — ABNORMAL LOW
RBC: 3.65 — ABNORMAL LOW
RDW: 13.2
WBC: 8.4
WBC: 8.8

## 2011-05-02 LAB — RPR: RPR Ser Ql: NONREACTIVE

## 2011-05-02 LAB — CROSSMATCH: ABO/RH(D): A POS

## 2011-05-02 LAB — CREATININE CLEARANCE, URINE, 24 HOUR
Collection Interval-CRCL: 24
Creatinine, 24H Ur: 1602
Creatinine, Urine: 59.9
Creatinine: 0.56
Urine Total Volume-CRCL: 2675

## 2011-05-02 LAB — FETAL FIBRONECTIN: Fetal Fibronectin: POSITIVE

## 2011-05-02 LAB — LACTATE DEHYDROGENASE: LDH: 122

## 2011-05-02 LAB — PROTEIN, URINE, 24 HOUR: Collection Interval-UPROT: 24

## 2011-05-03 LAB — POCT URINALYSIS DIP (DEVICE)
Bilirubin Urine: NEGATIVE
Ketones, ur: NEGATIVE
Operator id: 297281
Protein, ur: NEGATIVE
Specific Gravity, Urine: 1.02
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
## 2011-12-26 ENCOUNTER — Other Ambulatory Visit: Payer: Self-pay | Admitting: Family Medicine

## 2011-12-26 DIAGNOSIS — Z1231 Encounter for screening mammogram for malignant neoplasm of breast: Secondary | ICD-10-CM

## 2012-01-17 ENCOUNTER — Ambulatory Visit: Payer: Managed Care, Other (non HMO)

## 2012-02-23 ENCOUNTER — Ambulatory Visit
Admission: RE | Admit: 2012-02-23 | Discharge: 2012-02-23 | Disposition: A | Discharge status: Home / Self Care | Payer: Managed Care, Other (non HMO) | Source: Ambulatory Visit | Attending: Family Medicine | Admitting: Family Medicine

## 2012-02-23 DIAGNOSIS — Z1231 Encounter for screening mammogram for malignant neoplasm of breast: Secondary | ICD-10-CM

## 2014-04-14 ENCOUNTER — Encounter (HOSPITAL_COMMUNITY): Payer: Self-pay | Admitting: Emergency Medicine

## 2014-04-14 ENCOUNTER — Emergency Department (HOSPITAL_COMMUNITY): Payer: Managed Care, Other (non HMO)

## 2014-04-14 ENCOUNTER — Emergency Department (HOSPITAL_COMMUNITY)
Admission: EM | Admit: 2014-04-14 | Discharge: 2014-04-14 | Disposition: A | Discharge status: Home / Self Care | Payer: Managed Care, Other (non HMO) | Attending: Emergency Medicine | Admitting: Emergency Medicine

## 2014-04-14 DIAGNOSIS — I10 Essential (primary) hypertension: Secondary | ICD-10-CM | POA: Insufficient documentation

## 2014-04-14 DIAGNOSIS — Z3202 Encounter for pregnancy test, result negative: Secondary | ICD-10-CM | POA: Insufficient documentation

## 2014-04-14 HISTORY — DX: Essential (primary) hypertension: I10

## 2014-04-14 LAB — BASIC METABOLIC PANEL
ANION GAP: 14 (ref 5–15)
BUN: 8 mg/dL (ref 6–23)
CALCIUM: 9.8 mg/dL (ref 8.4–10.5)
CO2: 26 mEq/L (ref 19–32)
Chloride: 100 mEq/L (ref 96–112)
Creatinine, Ser: 0.72 mg/dL (ref 0.50–1.10)
GLUCOSE: 115 mg/dL — AB (ref 70–99)
Potassium: 3.8 mEq/L (ref 3.7–5.3)
SODIUM: 140 meq/L (ref 137–147)

## 2014-04-14 LAB — URINALYSIS, ROUTINE W REFLEX MICROSCOPIC
BILIRUBIN URINE: NEGATIVE
Glucose, UA: NEGATIVE mg/dL
HGB URINE DIPSTICK: NEGATIVE
Ketones, ur: 15 mg/dL — AB
Leukocytes, UA: NEGATIVE
NITRITE: NEGATIVE
PROTEIN: NEGATIVE mg/dL
Specific Gravity, Urine: 1.009 (ref 1.005–1.030)
UROBILINOGEN UA: 0.2 mg/dL (ref 0.0–1.0)
pH: 6.5 (ref 5.0–8.0)

## 2014-04-14 LAB — CBC WITH DIFFERENTIAL/PLATELET
BASOS ABS: 0 10*3/uL (ref 0.0–0.1)
BASOS PCT: 0 % (ref 0–1)
EOS ABS: 0 10*3/uL (ref 0.0–0.7)
EOS PCT: 0 % (ref 0–5)
HCT: 38.8 % (ref 36.0–46.0)
Hemoglobin: 13.2 g/dL (ref 12.0–15.0)
Lymphocytes Relative: 19 % (ref 12–46)
Lymphs Abs: 1.4 10*3/uL (ref 0.7–4.0)
MCH: 28.9 pg (ref 26.0–34.0)
MCHC: 34 g/dL (ref 30.0–36.0)
MCV: 84.9 fL (ref 78.0–100.0)
Monocytes Absolute: 0.3 10*3/uL (ref 0.1–1.0)
Monocytes Relative: 5 % (ref 3–12)
Neutro Abs: 5.4 10*3/uL (ref 1.7–7.7)
Neutrophils Relative %: 76 % (ref 43–77)
PLATELETS: 200 10*3/uL (ref 150–400)
RBC: 4.57 MIL/uL (ref 3.87–5.11)
RDW: 14.8 % (ref 11.5–15.5)
WBC: 7.1 10*3/uL (ref 4.0–10.5)

## 2014-04-14 LAB — PREGNANCY, URINE: Preg Test, Ur: NEGATIVE

## 2014-04-14 LAB — I-STAT TROPONIN, ED: TROPONIN I, POC: 0 ng/mL (ref 0.00–0.08)

## 2014-04-14 MED ORDER — LISINOPRIL 10 MG PO TABS: 10.0000 mg | ORAL_TABLET | Freq: Every day | ORAL | Status: DC

## 2014-04-14 MED ORDER — SODIUM CHLORIDE 0.9 % IV BOLUS (SEPSIS)
500.0000 mL | Freq: Once | INTRAVENOUS | Status: AC
  Administered 2014-04-14: 500 mL via INTRAVENOUS

## 2014-04-14 MED ORDER — METHYLPREDNISOLONE SODIUM SUCC 125 MG IJ SOLR
125.0000 mg | Freq: Once | INTRAMUSCULAR | Status: AC
  Administered 2014-04-14: 125 mg via INTRAVENOUS
  Filled 2014-04-14: qty 2

## 2014-04-14 MED ORDER — LABETALOL HCL 5 MG/ML IV SOLN
10.0000 mg | Freq: Once | INTRAVENOUS | Status: AC
  Administered 2014-04-14: 10 mg via INTRAVENOUS
  Filled 2014-04-14: qty 4

## 2014-04-14 MED ORDER — METOCLOPRAMIDE HCL 5 MG/ML IJ SOLN
10.0000 mg | Freq: Once | INTRAMUSCULAR | Status: AC
  Administered 2014-04-14: 10 mg via INTRAVENOUS
  Filled 2014-04-14: qty 2

## 2014-04-14 MED ORDER — DIPHENHYDRAMINE HCL 50 MG/ML IJ SOLN
25.0000 mg | Freq: Once | INTRAMUSCULAR | Status: AC
  Administered 2014-04-14: 25 mg via INTRAVENOUS
  Filled 2014-04-14: qty 1

## 2014-04-14 NOTE — Discharge Instructions (Signed)
Please follow with your primary care doctor in the next 5 days for high blood pressure evaluation. If you do not have a primary care doctor, present to urgent care. Reduce salt intake. Seek emergency medical care for unilateral weakness, slurring, change in vision, or chest pain and shortness of breath.  Please follow with your primary care doctor in the next 2 days for a check-up. They must obtain records for further management.   Do not hesitate to return to the Emergency Department for any new, worsening or concerning symptoms.    Hypertension Hypertension, commonly called high blood pressure, is when the force of blood pumping through your arteries is too strong. Your arteries are the blood vessels that carry blood from your heart throughout your body. A blood pressure reading consists of a higher number over a lower number, such as 110/72. The higher number (systolic) is the pressure inside your arteries when your heart pumps. The lower number (diastolic) is the pressure inside your arteries when your heart relaxes. Ideally you want your blood pressure below 120/80. Hypertension forces your heart to work harder to pump blood. Your arteries may become narrow or stiff. Having hypertension puts you at risk for heart disease, stroke, and other problems.  RISK FACTORS Some risk factors for high blood pressure are controllable. Others are not.  Risk factors you cannot control include:   Race. You may be at higher risk if you are African American.  Age. Risk increases with age.  Gender. Men are at higher risk than women before age 76 years. After age 60, women are at higher risk than men. Risk factors you can control include:  Not getting enough exercise or physical activity.  Being overweight.  Getting too much fat, sugar, calories, or salt in your diet.  Drinking too much alcohol. SIGNS AND SYMPTOMS Hypertension does not usually cause signs or symptoms. Extremely high blood pressure  (hypertensive crisis) may cause headache, anxiety, shortness of breath, and nosebleed. DIAGNOSIS  To check if you have hypertension, your health care provider will measure your blood pressure while you are seated, with your arm held at the level of your heart. It should be measured at least twice using the same arm. Certain conditions can cause a difference in blood pressure between your right and left arms. A blood pressure reading that is higher than normal on one occasion does not mean that you need treatment. If one blood pressure reading is high, ask your health care provider about having it checked again. TREATMENT  Treating high blood pressure includes making lifestyle changes and possibly taking medicine. Living a healthy lifestyle can help lower high blood pressure. You may need to change some of your habits. Lifestyle changes may include:  Following the DASH diet. This diet is high in fruits, vegetables, and whole grains. It is low in salt, red meat, and added sugars.  Getting at least 2 hours of brisk physical activity every week.  Losing weight if necessary.  Not smoking.  Limiting alcoholic beverages.  Learning ways to reduce stress. If lifestyle changes are not enough to get your blood pressure under control, your health care provider may prescribe medicine. You may need to take more than one. Work closely with your health care provider to understand the risks and benefits. HOME CARE INSTRUCTIONS  Have your blood pressure rechecked as directed by your health care provider.   Take medicines only as directed by your health care provider. Follow the directions carefully. Blood pressure medicines must  be taken as prescribed. The medicine does not work as well when you skip doses. Skipping doses also puts you at risk for problems.   Do not smoke.   Monitor your blood pressure at home as directed by your health care provider. SEEK MEDICAL CARE IF:   You think you are  having a reaction to medicines taken.  You have recurrent headaches or feel dizzy.  You have swelling in your ankles.  You have trouble with your vision. SEEK IMMEDIATE MEDICAL CARE IF:  You develop a severe headache or confusion.  You have unusual weakness, numbness, or feel faint.  You have severe chest or abdominal pain.  You vomit repeatedly.  You have trouble breathing. MAKE SURE YOU:   Understand these instructions.  Will watch your condition.  Will get help right away if you are not doing well or get worse. Document Released: 07/17/2005 Document Revised: 12/01/2013 Document Reviewed: 05/09/2013 [facility] Patient Information 2015 [place], [place]. This information is not intended to replace advice given to you by your health care provider. Make sure you discuss any questions you have with your health care provider.

## 2014-04-14 NOTE — ED Provider Notes (Signed)
CSN: [id number]     Arrival date & time 04/14/14  1559 History   First MD Initiated Contact with Patient 04/14/14 2012     Chief Complaint  Patient presents with  . Hypertension     (Consider location/radiation/quality/duration/timing/severity/associated sxs/prior Treatment) HPI  Carrie Greer is a 43 y.o. female sent in for evaluation of headaches and elevated blood pressure. Patient states she was getting a TB test prior to employment and her blood pressure was taken it was found to be extremely elevated: Greater than 200/100. Patient states she has a history of high blood pressure and has a significant family history as well. States that she was on blood pressure medications but she self DC'd them over a year ago. She reports her headache is bilateral temporal 6/10, no exacerbating or alleviating factors identified, lasting for 30-45 minutes a day. She states this is typical for her prior headaches. She denies any chest pain, shortness of breath, change in vision, dysarthria, ataxia nausea, vomiting, syncope, abdominal pain, change in bowel or bladder habits cough, fever chills, increasing peripheral edema, dyspnea on exertion, orthopnea.   Past Medical History  Diagnosis Date  . Hypertension    History reviewed. No pertinent past surgical history. No family history on file. History  Substance Use Topics  . Smoking status: Never Smoker   . Smokeless tobacco: Not on file  . Alcohol Use: Yes     Comment: social   OB History   Grav Para Term Preterm Abortions TAB SAB Ect Mult Living                 Review of Systems  10 systems reviewed and found to be negative, except as noted in the HPI.   Allergies  Review of patient's allergies indicates no known allergies.  Home Medications   Prior to Admission medications   Medication Sig Start Date End Date Taking? Authorizing Provider  lisinopril (PRINIVIL,ZESTRIL) 10 MG tablet Take 1 tablet (10 mg total) by mouth daily.  04/14/14   Kelley Polinsky, PA-C   BP 156/86  Pulse 65  Temp(Src) 97.8 F (36.6 C) (Oral)  Resp 15  Ht 5\' 6"  (1.676 m)  Wt 165 lb (74.844 kg)  BMI 26.64 kg/m2  SpO2 98%  LMP 03/20/2014 Physical Exam  Nursing note and vitals reviewed. Constitutional: She is oriented to person, place, and time. She appears well-developed and well-nourished. No distress.  HENT:  Head: Normocephalic.  Mouth/Throat: Oropharynx is clear and moist.  Eyes: Conjunctivae and EOM are normal. Pupils are equal, round, and reactive to light.  Neck: Normal range of motion. Neck supple. No JVD present.  Cardiovascular: Normal rate, regular rhythm and intact distal pulses.   Pulmonary/Chest: Effort normal and breath sounds normal. No stridor. No respiratory distress. She has no wheezes. She has no rales. She exhibits no tenderness.  Abdominal: Soft. Bowel sounds are normal. She exhibits no distension and no mass. There is no tenderness. There is no rebound and no guarding.  Musculoskeletal: Normal range of motion. She exhibits no edema and no tenderness.  Neurological: She is alert and oriented to person, place, and time.  Follows commands, Clear, goal oriented speech, Strength is 5 out of 5x4 extremities, patient ambulates with a coordinated in nonantalgic gait. Sensation is grossly intact.   Psychiatric: She has a normal mood and affect.    ED Course  Procedures (including critical care time) Labs Review Labs Reviewed  URINALYSIS, ROUTINE W REFLEX MICROSCOPIC - Abnormal; Notable for the following:  Ketones, ur 15 (*)    All other components within normal limits  BASIC METABOLIC PANEL - Abnormal; Notable for the following:    Glucose, Bld 115 (*)    All other components within normal limits  CBC WITH DIFFERENTIAL  PREGNANCY, URINE  I-STAT TROPOININ, ED    Imaging Review Dg Chest 2 View  04/14/2014   CLINICAL DATA:  Elevated blood pressure. Headaches. History of hypertension.  EXAM: CHEST  2 VIEW   COMPARISON:  08/27/2009 radiographs.  FINDINGS: The heart size and mediastinal contours are normal. The lungs are clear. There is no pleural effusion or pneumothorax. No acute osseous findings are identified.  IMPRESSION: Stable chest.  No acute cardiopulmonary process.   Electronically Signed   By: Camie Patience M.D.   On: 04/14/2014 18:55   Ct Head Wo Contrast  04/14/2014   CLINICAL DATA:  Headaches over the last several days. Hypertension. Out of medication.  EXAM: CT HEAD WITHOUT CONTRAST  TECHNIQUE: Contiguous axial images were obtained from the base of the skull through the vertex without intravenous contrast.  COMPARISON:  08/27/2009  FINDINGS: There is no intra or extra-axial fluid collection or mass lesion. The basilar cisterns and ventricles have a normal appearance. There is no CT evidence for acute infarction or hemorrhage. There is mucoperiosteal thickening within the ethmoid sinuses. No air-fluid levels are identified within the sinuses.  IMPRESSION: 1.  No evidence for acute intracranial abnormality. 2. Chronic sinusitis.   Electronically Signed   By: Shon Hale M.D.   On: 04/14/2014 20:44     EKG Interpretation None      MDM   Final diagnoses:  Essential hypertension    Filed Vitals:   04/14/14 2145 04/14/14 2200 04/14/14 2230 04/14/14 2300  BP: 200/98 194/106 190/121 156/86  Pulse: 61 62 62 65  Temp:      TempSrc:      Resp: 17  16 15   Height:      Weight:      SpO2: 100% 100% 100% 98%    Medications  labetalol (NORMODYNE,TRANDATE) injection 10 mg (10 mg Intravenous Given 04/14/14 2131)  metoCLOPramide (REGLAN) injection 10 mg (10 mg Intravenous Given 04/14/14 2141)  diphenhydrAMINE (BENADRYL) injection 25 mg (25 mg Intravenous Given 04/14/14 2141)  sodium chloride 0.9 % bolus 500 mL (500 mLs Intravenous New Bag/Given 04/14/14 2156)  methylPREDNISolone sodium succinate (SOLU-MEDROL) 125 mg/2 mL injection 125 mg (125 mg Intravenous Given 04/14/14 2140)    Carrie Greer is a 43 y.o. female presenting with elevated blood pressure 203/110 she has been noncompliant with her high blood pressure medications for greater than a year. Denies any symptoms other than typical headache. Neuro exam is nonfocal. EKG shows no abnormality. Urinalysis without proteinuria. Troponin is negative, CBC and chemistries normal. Head CT without abnormality and no cardiomegaly on chest x-ray. Patient will be given labetalol.   HA improved with headache cocktail. Blood pressure responded well to labetalol. We've had extensive discussion about importance of compliance with hypertension medications. Patient will be started on lisinopril, counseled her on possibility of angioedema and instructed her to DC and presented to ED if this were to occur.  Evaluation does not show pathology that would require ongoing emergent intervention or inpatient treatment. Pt is hemodynamically stable and mentating appropriately. Discussed findings and plan with patient/guardian, who agrees with care plan. All questions answered. Return precautions discussed and outpatient follow up given.   New Prescriptions   LISINOPRIL (PRINIVIL,ZESTRIL) 10 MG TABLET  Take 1 tablet (10 mg total) by mouth daily.         Monico Blitz, PA-C 04/14/14 [phone]

## 2014-04-14 NOTE — ED Notes (Signed)
Pt presents to department for evaluation of hypertension. Pt reports headaches over the past several days. States she is out of BP medications at the time. Denies pain at the time. Pt is alert and oriented x4.

## 2014-04-14 NOTE — ED Notes (Signed)
Apologized to pt for wait time. Pt in NAD. AO x4.  

## 2014-04-17 NOTE — ED Provider Notes (Signed)
History/physical exam/procedure(s) were performed by non-physician practitioner and as supervising physician I was immediately available for consultation/collaboration. I have reviewed all notes and am in agreement with care and plan.   Shaune Pollack, MD 04/17/14 [phone]

## 2014-06-19 ENCOUNTER — Ambulatory Visit (INDEPENDENT_AMBULATORY_CARE_PROVIDER_SITE_OTHER): Payer: Managed Care, Other (non HMO) | Admitting: Physician Assistant

## 2014-06-19 VITALS — BP 220/100 | HR 84 | Temp 97.8°F | Resp 16 | Ht 66.0 in | Wt 175.8 lb

## 2014-06-19 DIAGNOSIS — Z1329 Encounter for screening for other suspected endocrine disorder: Secondary | ICD-10-CM

## 2014-06-19 DIAGNOSIS — I10 Essential (primary) hypertension: Secondary | ICD-10-CM

## 2014-06-19 MED ORDER — LISINOPRIL-HYDROCHLOROTHIAZIDE 20-25 MG PO TABS: 1.0000 | ORAL_TABLET | Freq: Every day | ORAL | Status: DC

## 2014-06-19 NOTE — Progress Notes (Signed)
Subjective:    Patient ID: Carrie Greer, female    DOB: 1971-02-24, 43 y.o.   MRN: [id number]  Chief Complaint  Patient presents with  . Hypertension    HPI  43 year old female is here today for HTN follow up.   She was seen at the ED following finding out that she had an elevated BP in the 200s while doing a TB screening.  They performed an EKG and gave her 10 mg of lisinopril.  She was told to f/u with her PCP, but insurance fell through and she is now seeking f/u for HTN.  She states her home BPs start in the morning with 130s/90, but by nighttime she has an elevated BP of 160/90.  She denies any chest pain, sob, dyspnea, or headaches.  She has some swelling every now and then of her left leg, but then resolves in the morning.  She denies any trouble with vision.   She describes a diet that is not high in fatty foods.  She exercises 1/week at a zumba course, but states that she is constantly running after her 45 year old triplets.  She denies any HTN complications during her pregnancy.  She has fam hx of mother and father having HTN which surfaced in their 54s and 72s.  She states that she drinks a lot of water.   She is the mother of 32-- ages ranging 18yrs to 36yrs.     Review of Systems  Constitutional: Negative for fatigue.  Eyes: Negative for pain and visual disturbance.  Respiratory: Negative for apnea, cough and shortness of breath.   Cardiovascular: Positive for leg swelling. Negative for chest pain and palpitations.  Neurological: Negative for dizziness and headaches.    Family History  Problem Relation Age of Onset  . Cancer Mother   . Diabetes Mother   . Hypertension Mother   . Hypertension Father    Current Outpatient Prescriptions on File Prior to Visit  Medication Sig Dispense Refill  . lisinopril (PRINIVIL,ZESTRIL) 10 MG tablet Take 1 tablet (10 mg total) by mouth daily. 30 tablet 1   No current facility-administered medications on file prior to visit.    History   Social History  . Marital Status: Married    Spouse Name: N/A    Number of Children: N/A  . Years of Education: N/A   Social History Main Topics  . Smoking status: Never Smoker   . Smokeless tobacco: None  . Alcohol Use: Yes     Comment: social  . Drug Use: No  . Sexual Activity: None   Other Topics Concern  . None   Social History Narrative       Objective:   Physical Exam  Constitutional: She is oriented to person, place, and time. Vital signs are normal. She appears well-developed and well-nourished.  BP 220/100 mmHg  Pulse 84  Temp(Src) 97.8 F (36.6 C) (Oral)  Resp 16  Ht 5\' 6"  (1.676 m)  Wt 175 lb 12.8 oz (79.742 kg)  BMI 28.39 kg/m2  SpO2 99%  LMP 06/19/2014   Eyes: Pupils are equal, round, and reactive to light. Right conjunctiva is not injected. Right conjunctiva has no hemorrhage. Left conjunctiva is not injected. Left conjunctiva has no hemorrhage.  Neck: Normal range of motion. Normal carotid pulses and no JVD present. Carotid bruit is not present. Thyromegaly (Pronouced thyroid, however no nodules or mass appreciated.) present. No thyroid mass present.  Cardiovascular: Normal rate, regular rhythm, normal heart  sounds and intact distal pulses.  Exam reveals no gallop, no distant heart sounds, no friction rub and no decreased pulses.   No murmur heard. Pulmonary/Chest: Effort normal and breath sounds normal. No apnea. No respiratory distress. She has no decreased breath sounds.  Abdominal: She exhibits no abdominal bruit, no pulsatile midline mass and no mass. There is no tenderness.  Neurological: She is alert and oriented to person, place, and time.  Skin: Skin is warm and dry.     Assessment & Plan:  43 year old female is here today for HTN follow up.    Essential hypertension  Uncontrolled, however asymptomatic with no evidence of organ damage.  I am increasing her lisinopril and adding a diuretic for better response.  CMP ordered to  determine any renal discourse and potassium.  She will follow up in 5 days for bp recheck.  Advised patient to look at cuff at home to determine correct size, or bring to [place] for review.  If BP does not resolve, refer to cardiology and possible renal ultrasound for stenosis, and other HTN etiology.   Advised to go to ED for emergency symptoms. Discussed verbally and in written handout of DASH diet.   lisinopril-hydrochlorothiazide (PRINZIDE,ZESTORETIC) 20-25 MG per tablet, COMPLETE METABOLIC PANEL WITH GFR  Screening for thyroid disorder TSH  Ivar Drape, PA-C [facility] 11/20/20153:08 PM

## 2014-06-19 NOTE — Progress Notes (Signed)
I was directly involved with the patient's care and agree with the diagnosis and treatment plan.

## 2014-06-19 NOTE — Patient Instructions (Signed)
DASH Eating Plan °DASH stands for "Dietary Approaches to Stop Hypertension." The DASH eating plan is a healthy eating plan that has been shown to reduce high blood pressure (hypertension). Additional health benefits may include reducing the risk of type 2 diabetes mellitus, heart disease, and stroke. The DASH eating plan may also help with weight loss. °WHAT DO I NEED TO KNOW ABOUT THE DASH EATING PLAN? °For the DASH eating plan, you will follow these general guidelines: °· Choose foods with a percent daily value for sodium of less than 5% (as listed on the food label). °· Use salt-free seasonings or herbs instead of table salt or sea salt. °· Check with your health care provider or pharmacist before using salt substitutes. °· Eat lower-sodium products, often labeled as "lower sodium" or "no salt added." °· Eat fresh foods. °· Eat more vegetables, fruits, and low-fat dairy products. °· Choose whole grains. Look for the word "whole" as the first word in the ingredient list. °· Choose fish and skinless chicken or turkey more often than red meat. Limit fish, poultry, and meat to 6 oz (170 g) each day. °· Limit sweets, desserts, sugars, and sugary drinks. °· Choose heart-healthy fats. °· Limit cheese to 1 oz (28 g) per day. °· Eat more home-cooked food and less restaurant, buffet, and fast food. °· Limit fried foods. °· Cook foods using methods other than frying. °· Limit canned vegetables. If you do use them, rinse them well to decrease the sodium. °· When eating at a restaurant, ask that your food be prepared with less salt, or no salt if possible. °WHAT FOODS CAN I EAT? °Seek help from a dietitian for individual calorie needs. °Grains °Whole grain or whole wheat bread. Brown rice. Whole grain or whole wheat pasta. Quinoa, bulgur, and whole grain cereals. Low-sodium cereals. Corn or whole wheat flour tortillas. Whole grain cornbread. Whole grain crackers. Low-sodium crackers. °Vegetables °Fresh or frozen vegetables  (raw, steamed, roasted, or grilled). Low-sodium or reduced-sodium tomato and vegetable juices. Low-sodium or reduced-sodium tomato sauce and paste. Low-sodium or reduced-sodium canned vegetables.  °Fruits °All fresh, canned (in natural juice), or frozen fruits. °Meat and Other Protein Products °Ground beef (85% or leaner), grass-fed beef, or beef trimmed of fat. Skinless chicken or turkey. Ground chicken or turkey. Pork trimmed of fat. All fish and seafood. Eggs. Dried beans, peas, or lentils. Unsalted nuts and seeds. Unsalted canned beans. °Dairy °Low-fat dairy products, such as skim or 1% milk, 2% or reduced-fat cheeses, low-fat ricotta or cottage cheese, or plain low-fat yogurt. Low-sodium or reduced-sodium cheeses. °Fats and Oils °Tub margarines without trans fats. Light or reduced-fat mayonnaise and salad dressings (reduced sodium). Avocado. Safflower, olive, or canola oils. Natural peanut or almond butter. °Other °Unsalted popcorn and pretzels. °The items listed above may not be a complete list of recommended foods or beverages. Contact your dietitian for more options. °WHAT FOODS ARE NOT RECOMMENDED? °Grains °White bread. White pasta. White rice. Refined cornbread. Bagels and croissants. Crackers that contain trans fat. °Vegetables °Creamed or fried vegetables. Vegetables in a cheese sauce. Regular canned vegetables. Regular canned tomato sauce and paste. Regular tomato and vegetable juices. °Fruits °Dried fruits. Canned fruit in light or heavy syrup. Fruit juice. °Meat and Other Protein Products °Fatty cuts of meat. Ribs, chicken wings, bacon, sausage, bologna, salami, chitterlings, fatback, hot dogs, bratwurst, and packaged luncheon meats. Salted nuts and seeds. Canned beans with salt. °Dairy °Whole or 2% milk, cream, half-and-half, and cream cheese. Whole-fat or sweetened yogurt. Full-fat   cheeses or blue cheese. Nondairy creamers and whipped toppings. Processed cheese, cheese spreads, or cheese  curds. °Condiments °Onion and garlic salt, seasoned salt, table salt, and sea salt. Canned and packaged gravies. Worcestershire sauce. Tartar sauce. Barbecue sauce. Teriyaki sauce. Soy sauce, including reduced sodium. Steak sauce. Fish sauce. Oyster sauce. Cocktail sauce. Horseradish. Ketchup and mustard. Meat flavorings and tenderizers. Bouillon cubes. Hot sauce. Tabasco sauce. Marinades. Taco seasonings. Relishes. °Fats and Oils °Butter, stick margarine, lard, shortening, ghee, and bacon fat. Coconut, palm kernel, or palm oils. Regular salad dressings. °Other °Pickles and olives. Salted popcorn and pretzels. °The items listed above may not be a complete list of foods and beverages to avoid. Contact your dietitian for more information. °WHERE CAN I FIND MORE INFORMATION? °National [employer], [employer], and [employer] Institute: www.nhlbi.nih.gov/health/health-topics/topics/dash/ °Document Released: 07/06/2011 Document Revised: 12/01/2013 Document Reviewed: 05/21/2013 °ExitCare® Patient Information ©2015 [employer]. This information is not intended to replace advice given to you by your health care provider. Make sure you discuss any questions you have with your health care provider. ° °

## 2014-06-20 LAB — COMPLETE METABOLIC PANEL WITH GFR
ALBUMIN: 4.4 g/dL (ref 3.5–5.2)
ALT: 11 U/L (ref 0–35)
AST: 16 U/L (ref 0–37)
Alkaline Phosphatase: 46 U/L (ref 39–117)
BILIRUBIN TOTAL: 0.6 mg/dL (ref 0.2–1.2)
BUN: 12 mg/dL (ref 6–23)
CO2: 27 meq/L (ref 19–32)
Calcium: 9.7 mg/dL (ref 8.4–10.5)
Chloride: 101 mEq/L (ref 96–112)
Creat: 0.68 mg/dL (ref 0.50–1.10)
GLUCOSE: 93 mg/dL (ref 70–99)
Potassium: 4.4 mEq/L (ref 3.5–5.3)
SODIUM: 138 meq/L (ref 135–145)
TOTAL PROTEIN: 7.6 g/dL (ref 6.0–8.3)

## 2014-06-20 LAB — TSH: TSH: 1.964 u[IU]/mL (ref 0.350–4.500)

## 2014-06-24 ENCOUNTER — Ambulatory Visit (INDEPENDENT_AMBULATORY_CARE_PROVIDER_SITE_OTHER): Payer: Managed Care, Other (non HMO) | Admitting: Physician Assistant

## 2014-06-24 VITALS — BP 152/100 | HR 95 | Temp 98.0°F | Resp 18 | Ht 65.0 in | Wt 170.4 lb

## 2014-06-24 DIAGNOSIS — Z13220 Encounter for screening for lipoid disorders: Secondary | ICD-10-CM

## 2014-06-24 DIAGNOSIS — I10 Essential (primary) hypertension: Secondary | ICD-10-CM

## 2014-06-24 DIAGNOSIS — R7303 Prediabetes: Secondary | ICD-10-CM

## 2014-06-24 DIAGNOSIS — R7309 Other abnormal glucose: Secondary | ICD-10-CM

## 2014-06-24 NOTE — Progress Notes (Signed)
   Subjective:    Patient ID: Carrie Greer, female    DOB: [birth year], 43 y.o.   MRN: [id number]  Chief Complaint  Patient presents with  . Follow-up    BLOOD PRESSURE    HPI 43 year old female is here today for elevated BP follow up.  She was here 5 days ago when her BP was reported 220/140 at an outside health fair.  She was asymptomatic at this time.  It was confirmed here as well to be in the 220s.  She was asymptomatic and on lisinopril 10mg  for her newly found HTN in 3 months at the ED.  She failed to return to clinic for follow up d/t complication with her insurance.  We increased her dose to 20mg  with the addition of HCTZ 25mg .  She returns today after 5 days for BP recheck... She reports that she began the medication the day after being prescribed the medication.  Though asymptomatic, she reports less fatigue over the last few days.  She denies cp, palpitations, vision change, leg swelling, dizziness, or sob.  She rechecked her BP at home and was around 153/96. She currently is engaging in a minimal salt diet.  She uses dash substitute.   Patient states that she fasted today, to have her lipid checked.   Diabetes: No nausea, vomiting, dizziness, tremulousness, diarrhea.  She states that she had gestational diabetes with two pregnancies both 13 years and 6 years ago.  This lingered, but with lifestyle modifications, she was able to bring her blood sugar to a therapeutic range.     Past Medical History  Diagnosis Date  . Hypertension   . Diabetes mellitus without complication     Review of Systems ROS otherwise unremarkable unless specified.      Objective:   Physical Exam  Constitutional: She is oriented to person, place, and time. She appears well-developed and well-nourished.  BP 152/100 mmHg  Pulse 95  Temp(Src) 98 F (36.7 C) (Oral)  Resp 18  Ht 5\' 5"  (1.651 m)  Wt 170 lb 6.4 oz (77.293 kg)  BMI 28.36 kg/m2  SpO2 100%  LMP 06/19/2014   HENT:  Head:  Normocephalic and atraumatic.  Neck: Neck supple.  Cardiovascular: Normal rate, regular rhythm and normal heart sounds.   Pulmonary/Chest: Effort normal and breath sounds normal. No respiratory distress. She has no wheezes.  Neurological: She is alert and oriented to person, place, and time.  Skin: Skin is warm and dry. No erythema.  Psychiatric: She has a normal mood and affect. Her behavior is normal. Judgment and thought content normal.   Rechecked at 144/100     Assessment & Plan:  43 year old female is here today for a BP recheck.  Essential hypertension -BP has therapeutically decreased since 5 days ago.  We should see the efficacy of the lisinopril in about 3-4 weeks.  Then we will adjust medication or seek further workup for hypertension.  Right now, he is responding well to the anti-hypertensives. -BP cuff was checked, and appears to be correct size.   -Patient will check BP 3x/week.   -Patient will return to the clinic within 4 weeks for physical exam.  Pre-diabetes -Recheck a1c at physical exam.  Screening for lipid disorders  Lipid panel  Ivar Drape, PA-C [facility] 11/25/20158:26 PM

## 2014-06-24 NOTE — Progress Notes (Signed)
I have discussed this case with Ms. English, PA-C and agree.

## 2014-06-24 NOTE — Patient Instructions (Signed)
Go ahead and start your low impact exercise (ie walking, water aerobics).  Continue to take your medication as prescribed.    Recheck your BP 3x/week.

## 2014-06-25 LAB — LIPID PANEL
Cholesterol: 218 mg/dL — ABNORMAL HIGH (ref 0–200)
HDL: 71 mg/dL (ref >39)
LDL Cholesterol: 137 mg/dL — ABNORMAL HIGH (ref 0–99)
Total CHOL/HDL Ratio: 3.1 Ratio
Triglycerides: 49 mg/dL (ref <150)
VLDL: 10 mg/dL (ref 0–40)

## 2014-08-03 ENCOUNTER — Ambulatory Visit (INDEPENDENT_AMBULATORY_CARE_PROVIDER_SITE_OTHER): Payer: Managed Care, Other (non HMO) | Admitting: Physician Assistant

## 2014-08-03 VITALS — BP 148/78 | HR 90 | Temp 97.8°F | Resp 18 | Ht 65.0 in | Wt 168.0 lb

## 2014-08-03 DIAGNOSIS — Z0000 Encounter for general adult medical examination without abnormal findings: Secondary | ICD-10-CM

## 2014-08-03 NOTE — Patient Instructions (Signed)
Keeping You Healthy  Get These Tests 1. Blood Pressure- Have your blood pressure checked once a year by your health care provider.  Normal blood pressure is 120/80. 2. Weight- Have your body mass index (BMI) calculated to screen for obesity.  BMI is measure of body fat based on height and weight.  You can also calculate your own BMI at GravelBags.it. 3. Cholesterol- Have your cholesterol checked every 5 years starting at age 46 then yearly starting at age 84. 78. Chlamydia, HIV, and other sexually transmitted diseases- Get screened every year until age 75, then within three months of each new sexual provider. 5. Pap Smear- Every 1-5 years; discuss with your health care provider. 6. Mammogram- Every other year starting at age 43  Take these medicines  Calcium with Vitamin D-Your body needs 1200 mg of Calcium each day and [phone] IU of Vitamin D daily.  Your body can only absorb 500 mg of Calcium at a time so Calcium must be taken in 2 or 3 divided doses throughout the day.  Multivitamin with folic acid- Once daily if it is possible for you to become pregnant.  Get these Immunizations  Gardasil-Series of three doses; prevents HPV related illness such as genital warts and cervical cancer.  Menactra-Single dose; prevents meningitis.  Tetanus shot- Every 10 years.  Flu shot-Every year.  Take these steps 1. Do not smoke-Your healthcare provider can help you quit.  For tips on how to quit go to www.smokefree.gov or call 1-800 QUITNOW. 2. Be physically active- Exercise 5 days a week for at least 30 minutes.  If you are not already physically active, start slow and gradually work up to 30 minutes of moderate physical activity.  Examples of moderate activity include walking briskly, dancing, swimming, bicycling, etc. 3. Breast Cancer- A self breast exam every month is important for early detection of breast cancer.  For more information and instruction on self breast exams, ask your  healthcare provider or https://www.patel.info/. 4. Eat a healthy diet- Eat a variety of healthy foods such as fruits, vegetables, whole grains, low fat milk, low fat cheeses, yogurt, lean meats, poultry and fish, beans, nuts, tofu, etc.  For more information go to www. Thenutritionsource.org 5. Drink alcohol in moderation- Limit alcohol intake to one drink or less per day. Never drink and drive. 6. Depression- Your emotional health is as important as your physical health.  If you're feeling down or losing interest in things you normally enjoy please talk to your healthcare provider about being screened for depression. 7. Dental visit- Brush and floss your teeth twice daily; visit your dentist twice a year. 8. Eye doctor- Get an eye exam at least every 2 years. 9. Helmet use- Always wear a helmet when riding a bicycle, motorcycle, rollerblading or skateboarding. 57. Safe sex- If you may be exposed to sexually transmitted infections, use a condom. 11. Seat belts- Seat belts can save your live; always wear one. 12. Smoke/Carbon Monoxide detectors- These detectors need to be installed on the appropriate level of your home. Replace batteries at least once a year. 13. Skin cancer- When out in the sun please cover up and use sunscreen 15 SPF or higher. 14. Violence- If anyone is threatening or hurting you, please tell your healthcare provider.

## 2014-08-03 NOTE — Progress Notes (Signed)
Subjective:    Patient ID: Carrie Greer, female    DOB: July 22, 1971, 44 y.o.   MRN: [id number]   PCP: No PCP Per Patient  Chief Complaint  Patient presents with  . Annual Exam    no pap  . Follow-up    htn    No Known Allergies  Patient Active Problem List   Diagnosis Date Noted  . Essential hypertension 06/24/2014    Prior to Admission medications   Medication Sig Start Date End Date Taking? Authorizing Provider  lisinopril-hydrochlorothiazide (PRINZIDE,ZESTORETIC) 20-25 MG per tablet Take 1 tablet by mouth daily. 06/19/14  Yes Joretta Bachelor, PA    Past Medical History  Diagnosis Date  . Hypertension   . Gestational diabetes 2009   OB History    Gravida Para Term Preterm AB TAB SAB Ectopic Multiple Living   8 5 4 1 3 2 1  0 1 7     Past Surgical History  Procedure Laterality Date  . Tubal ligation    . Cesarean section     History   Social History  . Marital Status: Married    Spouse Name: Darcus Austin    Number of Children: 7  . 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 Smoking status: Never Smoker   . Smokeless tobacco: Never Used  . Alcohol Use: 0.0 - 1.8 oz/week    0-3 Not specified per week     Comment: wine on the weekends  . Drug Use: No  . Sexual Activity:    Partners: Male    Birth Control/ Protection: Surgical     Comment: BTL 2009 after the birth of triplets   Other Topics Concern  . None   Social History Narrative   Attending [employer] studying Early Childhood Development.   Lives with her husband and their 4 youngest children.   Her oldest daughter lives in [place].   Son and another daughter live nearby.     HPI  Presents for Annual Exam. Is currently menstruating, so prefers to defer the pap testing. Last pap 06/2008 after the delivery of triplets. Labs performed 4 weeks ago, not due for update for another 8 weeks. Overall, feels well. Declines flu  vaccine, "I don't get those."  Review of Systems  Constitutional: Negative.   HENT: Negative.   Eyes: Negative.   Respiratory: Negative.   Cardiovascular: Negative.   Gastrointestinal: Negative.   Genitourinary: Negative.   Musculoskeletal: Positive for back pain ("I think it's with age, and I sleep with triplets."). Negative for myalgias, joint swelling, arthralgias, gait problem, neck pain and neck stiffness.  Skin: Negative.   Neurological: Negative.   Psychiatric/Behavioral: Negative.        Objective:   Physical Exam  Constitutional: She is oriented to person, place, and time. Vital signs are normal. She appears well-developed and well-nourished. She is active and cooperative. No distress.  BP 148/78 mmHg  Pulse 90  Temp(Src) 97.8 F (36.6 C) (Oral)  Resp 18  Ht 5\' 5"  (1.651 m)  Wt 168 lb (76.204 kg)  BMI 27.96 kg/m2  SpO2 100%  LMP 07/25/2014   HENT:  Head: Normocephalic and atraumatic.  Right Ear: Hearing, tympanic membrane, external ear and ear canal normal. No foreign bodies.  Left Ear: Hearing, tympanic membrane, external ear and ear canal normal. No foreign bodies.  Nose: Nose normal.  Mouth/Throat: Uvula is midline, oropharynx is clear and  moist and mucous membranes are normal. No oral lesions. Normal dentition. No dental abscesses or uvula swelling. No oropharyngeal exudate.  Visual Acuity in Right Eye - Without correction: 20/13  With correction:  Visual Acuity in Left Eye - Without correction: 20/13  With correction:  Visual Acuity in Both Eyes - Without correction: 20/13  With correction:     Eyes: Conjunctivae, EOM and lids are normal. Pupils are equal, round, and reactive to light. Right eye exhibits no discharge. Left eye exhibits no discharge. No scleral icterus.  Fundoscopic exam:      The right eye shows no arteriolar narrowing, no AV nicking, no exudate, no hemorrhage and no papilledema. The right eye shows red reflex.       The left eye shows no  arteriolar narrowing, no AV nicking, no exudate, no hemorrhage and no papilledema. The left eye shows red reflex.  Neck: Trachea normal, normal range of motion and full passive range of motion without pain. Neck supple. No spinous process tenderness and no muscular tenderness present. No thyroid mass and no thyromegaly present.  Cardiovascular: Normal rate, regular rhythm, normal heart sounds, intact distal pulses and normal pulses.   Pulmonary/Chest: Effort normal and breath sounds normal. Right breast exhibits no inverted nipple, no mass, no nipple discharge, no skin change and no tenderness. Left breast exhibits no inverted nipple, no mass, no nipple discharge, no skin change and no tenderness. Breasts are symmetrical.  Genitourinary: No breast swelling, tenderness, discharge or bleeding.  Musculoskeletal: She exhibits no edema or tenderness.       Cervical back: Normal.       Thoracic back: Normal.       Lumbar back: Normal.  Lymphadenopathy:       Head (right side): No tonsillar, no preauricular, no posterior auricular and no occipital adenopathy present.       Head (left side): No tonsillar, no preauricular, no posterior auricular and no occipital adenopathy present.    She has no cervical adenopathy.       Right: No supraclavicular adenopathy present.       Left: No supraclavicular adenopathy present.  Neurological: She is alert and oriented to person, place, and time. She has normal strength and normal reflexes. No cranial nerve deficit. She exhibits normal muscle tone. Coordination and gait normal.  Skin: Skin is warm, dry and intact. No rash noted. She is not diaphoretic. No cyanosis or erythema. Nails show no clubbing.  Psychiatric: She has a normal mood and affect. Her speech is normal and behavior is normal. Judgment and thought content normal.          Assessment & Plan:  1. Annual physical exam Age appropriate anticipatory guidance provided. Continue current medication,  healthy lifestyle improvements. Fasting labs and pap testing in 8 weeks. Adjust medication dose at that time if BP remains >140/90.   Fara Chute, PA-C Physician Assistant-Certified [facility]

## 2014-08-04 ENCOUNTER — Encounter: Payer: Self-pay | Admitting: Physician Assistant

## 2014-08-10 NOTE — Progress Notes (Signed)
Left message for patient to call back to schedule for labs and pap.

## 2015-07-18 ENCOUNTER — Other Ambulatory Visit: Payer: Self-pay | Admitting: Physician Assistant

## 2015-08-05 ENCOUNTER — Ambulatory Visit (INDEPENDENT_AMBULATORY_CARE_PROVIDER_SITE_OTHER): Payer: Managed Care, Other (non HMO) | Admitting: Emergency Medicine

## 2015-08-05 VITALS — BP 146/88 | HR 82 | Temp 97.9°F | Resp 16 | Ht 66.0 in | Wt 171.0 lb

## 2015-08-05 DIAGNOSIS — Z111 Encounter for screening for respiratory tuberculosis: Secondary | ICD-10-CM

## 2015-08-05 DIAGNOSIS — I10 Essential (primary) hypertension: Secondary | ICD-10-CM | POA: Diagnosis not present

## 2015-08-05 MED ORDER — LISINOPRIL-HYDROCHLOROTHIAZIDE 20-25 MG PO TABS: ORAL_TABLET | ORAL | Status: DC

## 2015-08-05 NOTE — Patient Instructions (Signed)
Tuberculin Skin Test WHY AM I HAVING THIS TEST? Tuberculosis (TB) is a bacterial infection caused by Mycobacterium tuberculosis. Most people who are exposed to these bacteria have a strong enough defense (immune) system to prevent the bacteria from causing TB and developing symptoms. Their bodies prevent the germs from being active and making them sick (latent TB infection).  However, if you have TB germs in your body and your immune system is weak, you can develop a TB infection. This can cause symptoms such as:   Night sweats.  Fever.  Weakness.  Weight loss. A latent TB infection can also become active later in life if your immune system becomes weakened or compromised. You may have this test if your health care provider suspects that you have TB. You may also have this test to screen for TB if you are at risk for getting the disease. Those at increased risk include:  People who inject illegal drugs or share needles.  People with HIV or other diseases that affect immunity.  Health care workers.  People who live in high-risk communities, such as homeless shelters, nursing homes, and correctional facilities.  People who have been in contact with someone with TB.  People from countries where TB is more common. If you are in a high-risk group, your health care provider may wish to screen for TB more often. This can help prevent the spread of the disease. Sometimes TB screening is required when starting a new job, such as becoming a health care worker or a teacher. Colleges or universities may require it of new students. HOW WILL I BE TESTED? A tuberculin skin test is the main test used to check for exposure to the bacteria that can cause TB. The test checks for antibodies to the bacteria. Antibodies are proteins that your body produces to protect you from germs and other things that can make you sick. Your health care provider will inject a solution known as PPD (purified protein  derivative) under the first layer of skin on your arm. This causes a blister-like bubble to form at the site. Your health care provider will then examine the site after a number of hours have passed to see if a reaction has occurred. HOW DO I PREPARE FOR THE TEST? There is no preparation required for this test. WHAT DO THE RESULTS MEAN? Your test results will be reported as either negative or positive.  If the tuberculin skin test produces a negative result, it is likely that you do not have TB and have not been exposed to the TB bacteria. If you or your health care provider suspects exposure, however, you may want to repeat the test a few weeks later. A blood test may also be used to check for TB. This is because you will not react to the tuberculin skin test until several weeks after exposure to TB bacteria. If you test positive to the tuberculin skin test, it is likely that you have been exposed to TB bacteria. The test does not distinguish between an active and a latent TB infection. A false-positive result can occur. A false-positive result for TB bacteria is incorrect because it indicates a condition or finding is present when it is not. Talk to your health care provider to discuss your results, treatment options, and if necessary, the need for more tests. It is your responsibility to obtain your test results. Ask the lab or department performing the test when and how you will get your results. Talk with your   health care provider if you have any questions about your results.   This information is not intended to replace advice given to you by your health care provider. Make sure you discuss any questions you have with your health care provider.   Document Released: 04/26/2005 Document Revised: 08/07/2014 Document Reviewed: 11/10/2013 Elsevier Interactive Patient Education 2016 Elsevier Inc.  

## 2015-08-05 NOTE — Progress Notes (Signed)
Subjective:  Patient ID: Carrie Greer, female    DOB: [birth year]  Age: 45 y.o. MRN: [id number]  CC: PPD Reading and Medication Refill   HPI Carrie Greer presents   Patient has come in requesting a TB skin test. She also requires a refill on her antihypertensive. She been tolerating medication well and is compliant with treatment. She said she is a little anxious prior to visits and has no evidence of end organ injury.  History Carrie Greer has a past medical history of Hypertension and Gestational diabetes (2009).   She has past surgical history that includes Tubal ligation and Cesarean section.   Her  family history includes Cancer (age of onset: 20) in her mother; Diabetes in her mother; Hypertension in her father and mother.  She   reports that she has never smoked. She has never used smokeless tobacco. She reports that she drinks alcohol. She reports that she does not use illicit drugs.  Outpatient Prescriptions Prior to Visit  Medication Sig Dispense Refill  . lisinopril-hydrochlorothiazide (PRINZIDE,ZESTORETIC) 20-25 MG tablet TAKE 1 TABLET BY MOUTH DAILY   "OFFICE VISIT NEEDED FOR REFILLS" 30 tablet 0   No facility-administered medications prior to visit.    Social History   Social History  . Marital Status: Married    Spouse Name: Darcus Austin  . Number of Children: 7  . 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 Smoking status: Never Smoker   . Smokeless tobacco: Never Used  . Alcohol Use: 0.0 - 1.8 oz/week    0-3 Standard drinks or equivalent per week     Comment: wine on the weekends  . Drug Use: No  . Sexual Activity:    Partners: Male    Birth Control/ Protection: Surgical     Comment: BTL 2009 after the birth of triplets   Other Topics Concern  . None   Social History Narrative   Attending [employer] studying Early Childhood Development.   Lives with her husband and their 4  youngest children.   Her oldest daughter lives in [place].   Son and another daughter live nearby.     Review of Systems  Constitutional: Negative for fever, chills and appetite change.  HENT: Negative for congestion, ear pain, postnasal drip, sinus pressure and sore throat.   Eyes: Negative for pain and redness.  Respiratory: Negative for cough, shortness of breath and wheezing.   Cardiovascular: Negative for leg swelling.  Gastrointestinal: Negative for nausea, vomiting, abdominal pain, diarrhea, constipation and blood in stool.  Endocrine: Negative for polyuria.  Genitourinary: Negative for dysuria, urgency, frequency and flank pain.  Musculoskeletal: Negative for gait problem.  Skin: Negative for rash.  Neurological: Negative for weakness and headaches.  Psychiatric/Behavioral: Negative for confusion and decreased concentration. The patient is not nervous/anxious.     Objective:  BP 146/88 mmHg  Pulse 82  Temp(Src) 97.9 F (36.6 C) (Oral)  Resp 16  Ht 5\' 6"  (1.676 m)  Wt 171 lb (77.565 kg)  BMI 27.61 kg/m2  SpO2 97%  LMP 08/02/2015  Physical Exam  Constitutional: She is oriented to person, place, and time. She appears well-developed and well-nourished.  HENT:  Head: Normocephalic and atraumatic.  Eyes: Conjunctivae are normal. Pupils are equal, round, and reactive to light.  Pulmonary/Chest: Effort normal.  Musculoskeletal: She exhibits no edema.  Neurological: She is alert and oriented to person, place, and time.  Skin: Skin  is dry.  Psychiatric: She has a normal mood and affect. Her behavior is normal. Thought content normal.      Assessment & Plan:   Carrie Greer was seen today for ppd reading and medication refill.  Diagnoses and all orders for this visit:  Essential hypertension  Screening for tuberculosis -     TB Skin Test  Other orders -     lisinopril-hydrochlorothiazide (PRINZIDE,ZESTORETIC) 20-25 MG tablet; TAKE 1 TABLET BY MOUTH DAILY  I  have changed Ms. Heathcock's lisinopril-hydrochlorothiazide.  Meds ordered this encounter  Medications  . lisinopril-hydrochlorothiazide (PRINZIDE,ZESTORETIC) 20-25 MG tablet    Sig: TAKE 1 TABLET BY MOUTH DAILY    Dispense:  30 tablet    Refill:  5    Appropriate red flag conditions were discussed with the patient as well as actions that should be taken.  Patient expressed his understanding.  Follow-up: Return in about 6 months (around 02/02/2016).  Roselee Culver, MD

## 2015-08-05 NOTE — Progress Notes (Signed)

## 2015-08-10 ENCOUNTER — Ambulatory Visit (INDEPENDENT_AMBULATORY_CARE_PROVIDER_SITE_OTHER): Payer: Managed Care, Other (non HMO) | Admitting: Radiology

## 2015-08-10 DIAGNOSIS — Z111 Encounter for screening for respiratory tuberculosis: Secondary | ICD-10-CM

## 2015-08-10 NOTE — Progress Notes (Signed)
Patient presented to [facility] for a PPD read.  This clinic was closed due to inclement weather on the day she needed to have it read.  Today's visit will be a no-charge due to same.

## 2015-08-10 NOTE — Progress Notes (Signed)
   Subjective:    Patient ID: Carrie Greer, female    DOB: [birth year], 45 y.o.   MRN: [id number]  HPI    Review of Systems     Objective:   Physical Exam        Assessment & Plan:   Tuberculosis Risk Questionnaire  1. No Were you born outside the [place] in one of the following parts of the world: [place], [place], [place], [place] or [place]?    2. No Have you traveled outside the [place] and lived for more than one month in one of the following parts of the world: [place], [place], [place], [place] or [place]?    3. No Do you have a compromised immune system such as from any of the following conditions:HIV/AIDS, organ or bone marrow transplantation, diabetes, immunosuppressive medicines (e.g. Prednisone, Remicaide), leukemia, lymphoma, cancer of the head or neck, gastrectomy or jejunal bypass, end-stage renal disease (on dialysis), or silicosis?     4. Yes  Have you ever or do you plan on working in: a residential care center, a health care facility, a jail or prison or homeless shelter?    5. No Have you ever: injected illegal drugs, used crack cocaine, lived in a homeless shelter  or been in jail or prison?     6. No Have you ever been exposed to anyone with infectious tuberculosis?    Tuberculosis Symptom Questionnaire  Do you currently have any of the following symptoms?  1. No Unexplained cough lasting more than 3 weeks?   2. No Unexplained fever lasting more than 3 weeks.   3. No Night Sweats (sweating that leaves the bedclothes and sheets wet)     4. No Shortness of Breath   5. No Chest Pain   6. No Unintentional weight loss    7. No Unexplained fatigue (very tired for no reason)

## 2015-08-12 ENCOUNTER — Ambulatory Visit (INDEPENDENT_AMBULATORY_CARE_PROVIDER_SITE_OTHER): Payer: Managed Care, Other (non HMO) | Admitting: *Deleted

## 2015-08-12 DIAGNOSIS — Z111 Encounter for screening for respiratory tuberculosis: Secondary | ICD-10-CM

## 2015-08-12 DIAGNOSIS — Z7689 Persons encountering health services in other specified circumstances: Secondary | ICD-10-CM

## 2015-08-12 LAB — TB SKIN TEST
Induration: 0 mm
TB Skin Test: NEGATIVE

## 2016-01-07 ENCOUNTER — Telehealth: Payer: Self-pay | Admitting: Physician Assistant

## 2016-01-07 NOTE — Telephone Encounter (Signed)
Received a fax from [employer], attention Ivar Drape, PA-C requesting a DNA Drug Sensitivity test on the patient's behalf.  I last saw the patient in 07/2014 for a CPE. Ms. Cleophus Molt last saw the patient for HTN 05/2014. Most recent visit here was 08/2015 with Dr. Ouida Sills for HTN. (she was here today for PPD, but did not see a provider)  Please clarify with the patient what she is having done, etc.

## 2016-01-10 NOTE — Telephone Encounter (Signed)
LMTRC jp/cma  

## 2016-01-10 NOTE — Telephone Encounter (Signed)
Patient states that she is not sure why this was sent to [place] and she will look into it

## 2016-02-18 ENCOUNTER — Ambulatory Visit (INDEPENDENT_AMBULATORY_CARE_PROVIDER_SITE_OTHER): Payer: Managed Care, Other (non HMO) | Admitting: Physician Assistant

## 2016-02-18 VITALS — BP 160/82 | HR 101 | Temp 98.6°F | Resp 16 | Ht 65.0 in | Wt 173.0 lb

## 2016-02-18 DIAGNOSIS — Z1329 Encounter for screening for other suspected endocrine disorder: Secondary | ICD-10-CM

## 2016-02-18 DIAGNOSIS — Z124 Encounter for screening for malignant neoplasm of cervix: Secondary | ICD-10-CM

## 2016-02-18 DIAGNOSIS — Z13228 Encounter for screening for other metabolic disorders: Secondary | ICD-10-CM | POA: Diagnosis not present

## 2016-02-18 DIAGNOSIS — Z130 Encounter for screening for diseases of the blood and blood-forming organs and certain disorders involving the immune mechanism: Secondary | ICD-10-CM

## 2016-02-18 DIAGNOSIS — Z0000 Encounter for general adult medical examination without abnormal findings: Secondary | ICD-10-CM | POA: Diagnosis not present

## 2016-02-18 NOTE — Progress Notes (Signed)
[facility] [address], [place] [address] [phone]  Date:  02/18/2016   Name:  Carrie Greer   DOB:  04-16-1971   MRN:  [id number]  PCP:  No PCP Per Patient    History of Present Illness:  Carrie Greer is a 45 y.o. female patient who presents to [facility] for annual physical exam.    Diet: No restrictions.  Restricting from fried foods.  Rare pork.  Oxtail and fish.  No canned foods--eats more fresh or frozen.  Water intake--32oz or more, and drinks gatorade.  Drinks crystal lite.    BM: Normal.  No blood in stool, or black stool.  No constipation or diarrhea.  Urination: no dysuria, hematuria, or frequency.    Sleep: 41 year olds triplets.  Dinners, 7-8 hours.  No difficulty getting or staying asleep.  Menses: 12 days of menstruation--tubal ligation.  The extent of bleeding was for the duration.  Patient's last menstrual period was 02/09/2016.  Sexually active without protection.  No lower abdominal pain, or nausea.  She has recent hotflashes that she reports.  No dizziness, sob, or chest pains.      Social Activity: maxi climber.  Read and walk.  Travel.  Cook.   EtOH: occasionally for special occasional wine twice per month Tobacco or vaping: never Illicit drug: marijuana sometimes.    Patient Active Problem List   Diagnosis Date Noted  . Essential hypertension 06/24/2014    Past Medical History  Diagnosis Date  . Hypertension   . Gestational diabetes 2009    Past Surgical History  Procedure Laterality Date  . Tubal ligation    . Cesarean section      Social History  Substance Use Topics  . Smoking status: Never Smoker   . Smokeless tobacco: Never Used  . Alcohol Use: 0.0 - 1.8 oz/week    0-3 Standard drinks or equivalent per week     Comment: wine on the weekends    Family History  Problem Relation Age of Onset  . Cancer Mother 73    2005; removed a golf-ball sized tumor from behind her LEFT ear  . Diabetes Mother      lifestyle controlled  . Hypertension Mother   . Hypertension Father     No Known Allergies  Medication list has been reviewed and updated.  Current Outpatient Prescriptions on File Prior to Visit  Medication Sig Dispense Refill  . lisinopril-hydrochlorothiazide (PRINZIDE,ZESTORETIC) 20-25 MG tablet TAKE 1 TABLET BY MOUTH DAILY 30 tablet 5   No current facility-administered medications on file prior to visit.    Review of Systems  Constitutional: Negative for chills and fever.  HENT: Negative for ear discharge, ear pain and sore throat.   Eyes: Negative for blurred vision and double vision.  Respiratory: Negative for cough, shortness of breath and wheezing.   Cardiovascular: Negative for chest pain, palpitations and leg swelling.  Gastrointestinal: Negative for diarrhea, nausea and vomiting.  Genitourinary: Negative for dysuria, frequency and hematuria.  Skin: Negative for itching and rash.  Neurological: Negative for dizziness and headaches.     Physical Examination: BP 160/82 mmHg  Pulse 101  Temp(Src) 98.6 F (37 C) (Oral)  Resp 16  Ht 5\' 5"  (1.651 m)  Wt 173 lb (78.472 kg)  BMI 28.79 kg/m2  SpO2 100%  LMP 02/09/2016 Ideal Body Weight: Weight in (lb) to have BMI = 25: 149.9  Physical Exam  Constitutional: She is oriented to person, place, and  time. She appears well-developed and well-nourished. No distress.  HENT:  Head: Normocephalic and atraumatic.  Right Ear: Tympanic membrane, external ear and ear canal normal.  Left Ear: Tympanic membrane, external ear and ear canal normal.  Nose: Right sinus exhibits no maxillary sinus tenderness and no frontal sinus tenderness. Left sinus exhibits no maxillary sinus tenderness and no frontal sinus tenderness.  Mouth/Throat: Oropharynx is clear and moist. No uvula swelling. No oropharyngeal exudate, posterior oropharyngeal edema or posterior oropharyngeal erythema.  Eyes: Conjunctivae and EOM are normal. Pupils are equal,  round, and reactive to light.  Neck: Normal range of motion. Neck supple. No thyromegaly present.  Cardiovascular: Normal rate, regular rhythm, normal heart sounds and intact distal pulses.  Exam reveals no gallop, no distant heart sounds and no friction rub.   No murmur heard. Pulmonary/Chest: Effort normal and breath sounds normal. No respiratory distress. She has no decreased breath sounds. She has no wheezes. She has no rhonchi.  Abdominal: Soft. Bowel sounds are normal. She exhibits no distension and no mass. There is no tenderness.  Musculoskeletal: Normal range of motion. She exhibits no edema or tenderness.  Lymphadenopathy:       Head (right side): No submandibular, no tonsillar, no preauricular and no posterior auricular adenopathy present.       Head (left side): No submandibular, no tonsillar, no preauricular and no posterior auricular adenopathy present.    She has no cervical adenopathy.  Neurological: She is alert and oriented to person, place, and time. No cranial nerve deficit. She exhibits normal muscle tone. Coordination normal.  Skin: Skin is warm and dry. She is not diaphoretic.  Psychiatric: She has a normal mood and affect. Her behavior is normal.   Assessment and Plan: Carrie Greer is a 45 y.o. female who is here today for annual physical exam. --following labs obtained today. Annual physical exam - Plan: CBC, COMPLETE METABOLIC PANEL WITH GFR, TSH, Pap IG, CT/NG w/ reflex HPV when ASC-U  Screening for cervical cancer - Plan: Pap IG, CT/NG w/ reflex HPV when ASC-U  Screening for deficiency anemia - Plan: CBC  Screening for thyroid disorder - Plan: TSH  Screening for metabolic disorder - Plan: COMPLETE METABOLIC PANEL WITH GFR  Ivar Drape, PA-C [facility] 02/18/2016 4:37 PM

## 2016-02-18 NOTE — Patient Instructions (Addendum)
IF you received an x-ray today, you will receive an invoice from [facility]. Please contact [facility] at [phone] with questions or concerns regarding your invoice.   IF you received labwork today, you will receive an invoice from [employer]. Please contact [facility] at [phone] with questions or concerns regarding your invoice.   Our billing staff will not be able to assist you with questions regarding bills from these companies.  You will be contacted with the lab results as soon as they are available. The fastest way to get your results is to activate your My Chart account. Instructions are located on the last page of this paperwork. If you have not heard from [place] regarding the results in 2 weeks, please contact this office.   Please make sure that you are hydrating with 64 oz of water if not more. Exercise sounds good.  Please make sure you are doing this about 4 times per week for about 30 minutes.  Keeping You Healthy  Get These Tests 1. Blood Pressure- Have your blood pressure checked once a year by your health care provider.  Normal blood pressure is 120/80. 2. Weight- Have your body mass index (BMI) calculated to screen for obesity.  BMI is measure of body fat based on height and weight.  You can also calculate your own BMI at GravelBags.it. 3. Cholesterol- Have your cholesterol checked every 5 years starting at age 74 then yearly starting at age 11. 93. Chlamydia, HIV, and other sexually transmitted diseases- Get screened every year until age 89, then within three months of each new sexual provider. 5. Pap Test - Every 1-5 years; discuss with your health care provider. 6. Mammogram- Every 1-2 years starting at age 99--50  Take these medicines  Calcium with Vitamin D-Your body needs 1200 mg of Calcium each day and [phone] IU of Vitamin D daily.  Your body can only absorb 500 mg of Calcium at a time so Calcium  must be taken in 2 or 3 divided doses throughout the day.  Multivitamin with folic acid- Once daily if it is possible for you to become pregnant.  Get these Immunizations  Gardasil-Series of three doses; prevents HPV related illness such as genital warts and cervical cancer.  Menactra-Single dose; prevents meningitis.  Tetanus shot- Every 10 years.  Flu shot-Every year.  Take these steps 1. Do not smoke-Your healthcare provider can help you quit.  For tips on how to quit go to www.smokefree.gov or call 1-800 QUITNOW. 2. Be physically active- Exercise 5 days a week for at least 30 minutes.  If you are not already physically active, start slow and gradually work up to 30 minutes of moderate physical activity.  Examples of moderate activity include walking briskly, dancing, swimming, bicycling, etc. 3. Breast Cancer- A self breast exam every month is important for early detection of breast cancer.  For more information and instruction on self breast exams, ask your healthcare provider or https://www.patel.info/. 4. Eat a healthy diet- Eat a variety of healthy foods such as fruits, vegetables, whole grains, low fat milk, low fat cheeses, yogurt, lean meats, poultry and fish, beans, nuts, tofu, etc.  For more information go to www. Thenutritionsource.org 5. Drink alcohol in moderation- Limit alcohol intake to one drink or less per day. Never drink and drive. 6. Depression- Your emotional health is as important as your physical health.  If you're feeling down or losing interest in things you normally enjoy please talk to  your healthcare provider about being screened for depression. 7. Dental visit- Brush and floss your teeth twice daily; visit your dentist twice a year. 8. Eye doctor- Get an eye exam at least every 2 years. 9. Helmet use- Always wear a helmet when riding a bicycle, motorcycle, rollerblading or skateboarding. 70. Safe sex- If you may be exposed to sexually  transmitted infections, use a condom. 11. Seat belts- Seat belts can save your live; always wear one. 12. Smoke/Carbon Monoxide detectors- These detectors need to be installed on the appropriate level of your home. Replace batteries at least once a year. 13. Skin cancer- When out in the sun please cover up and use sunscreen 15 SPF or higher. 14. Violence- If anyone is threatening or hurting you, please tell your healthcare provider.

## 2016-02-19 LAB — COMPLETE METABOLIC PANEL WITH GFR
ALT: 9 U/L (ref 6–29)
AST: 13 U/L (ref 10–35)
Albumin: 4.6 g/dL (ref 3.6–5.1)
Alkaline Phosphatase: 42 U/L (ref 33–115)
BUN: 15 mg/dL (ref 7–25)
CALCIUM: 9.8 mg/dL (ref 8.6–10.2)
CHLORIDE: 101 mmol/L (ref 98–110)
CO2: 30 mmol/L (ref 20–31)
Creat: 1.02 mg/dL (ref 0.50–1.10)
GFR, EST NON AFRICAN AMERICAN: 66 mL/min (ref >=60)
GFR, Est African American: 76 mL/min (ref >=60)
Glucose, Bld: 99 mg/dL (ref 65–99)
POTASSIUM: 3.6 mmol/L (ref 3.5–5.3)
Sodium: 140 mmol/L (ref 135–146)
Total Bilirubin: 0.8 mg/dL (ref 0.2–1.2)
Total Protein: 7.7 g/dL (ref 6.1–8.1)

## 2016-02-19 LAB — CBC
HCT: 39 % (ref 35.0–45.0)
Hemoglobin: 13.1 g/dL (ref 11.7–15.5)
MCH: 30.7 pg (ref 27.0–33.0)
MCHC: 33.6 g/dL (ref 32.0–36.0)
MCV: 91.3 fL (ref 80.0–100.0)
MPV: 10.3 fL (ref 7.5–12.5)
PLATELETS: 138 10*3/uL — AB (ref 140–400)
RBC: 4.27 MIL/uL (ref 3.80–5.10)
RDW: 14.1 % (ref 11.0–15.0)
WBC: 6 10*3/uL (ref 3.8–10.8)

## 2016-02-19 LAB — TSH: TSH: 2.14 m[IU]/L

## 2016-02-22 LAB — PAP IG, CT-NG, RFX HPV ASCU
Chlamydia Probe Amp: NOT DETECTED
GC Probe Amp: NOT DETECTED

## 2016-03-10 ENCOUNTER — Ambulatory Visit: Payer: Managed Care, Other (non HMO) | Admitting: Physician Assistant

## 2016-03-10 ENCOUNTER — Other Ambulatory Visit: Payer: Self-pay | Admitting: *Deleted

## 2016-03-10 ENCOUNTER — Ambulatory Visit (INDEPENDENT_AMBULATORY_CARE_PROVIDER_SITE_OTHER): Payer: Managed Care, Other (non HMO) | Admitting: Physician Assistant

## 2016-03-10 DIAGNOSIS — R7989 Other specified abnormal findings of blood chemistry: Secondary | ICD-10-CM

## 2016-03-10 DIAGNOSIS — D473 Essential (hemorrhagic) thrombocythemia: Secondary | ICD-10-CM

## 2016-03-10 LAB — CBC WITH DIFFERENTIAL/PLATELET
BASOS PCT: 0 %
Basophils Absolute: 0 cells/uL (ref 0–200)
EOS PCT: 0 %
Eosinophils Absolute: 0 cells/uL — ABNORMAL LOW (ref 15–500)
HCT: 39.1 % (ref 35.0–45.0)
Hemoglobin: 13.3 g/dL (ref 11.7–15.5)
LYMPHS PCT: 34 %
Lymphs Abs: 2380 cells/uL (ref 850–3900)
MCH: 30.4 pg (ref 27.0–33.0)
MCHC: 34 g/dL (ref 32.0–36.0)
MCV: 89.3 fL (ref 80.0–100.0)
MONOS PCT: 7 %
MPV: 11.5 fL (ref 7.5–12.5)
Monocytes Absolute: 490 cells/uL (ref 200–950)
NEUTROS ABS: 4130 {cells}/uL (ref 1500–7800)
Neutrophils Relative %: 59 %
PLATELETS: 175 10*3/uL (ref 140–400)
RBC: 4.38 MIL/uL (ref 3.80–5.10)
RDW: 13.9 % (ref 11.0–15.0)
WBC: 7 10*3/uL (ref 3.8–10.8)

## 2016-03-12 NOTE — Progress Notes (Signed)
Lab only 

## 2016-04-24 ENCOUNTER — Other Ambulatory Visit: Payer: Self-pay

## 2016-04-24 ENCOUNTER — Telehealth: Payer: Self-pay

## 2016-04-24 NOTE — Telephone Encounter (Signed)
Prior auth is pending

## 2016-05-04 MED ORDER — LISINOPRIL-HYDROCHLOROTHIAZIDE 20-25 MG PO TABS: ORAL_TABLET | ORAL | 0 refills | Status: DC

## 2016-05-04 NOTE — Addendum Note (Signed)
Addended by: Elwyn Reach A on: 05/04/2016 04:30 PM   Modules accepted: Orders

## 2016-05-04 NOTE — Telephone Encounter (Signed)
Received notice that Carrie Greer could not find pt in their system to do PA on lisinopril/HCTZ. Called pharm because a PA is never needed for this med. Pharm clarified that they need a RF of this med for pt. Since pt had annual PE in July and was on this medication, I gave a 90 day RF, and then pt will be due for OV.

## 2016-07-06 ENCOUNTER — Other Ambulatory Visit: Payer: Self-pay

## 2016-07-06 MED ORDER — LISINOPRIL-HYDROCHLOROTHIAZIDE 20-25 MG PO TABS: ORAL_TABLET | ORAL | 1 refills | Status: DC

## 2016-07-06 NOTE — Telephone Encounter (Signed)
Pt called and req'd RFs of isinopril in 30 day supplies. Pharm told her they don't have any more on file, even though should have 1 more month. Sent RFs.

## 2016-10-01 ENCOUNTER — Other Ambulatory Visit: Payer: Self-pay | Admitting: Physician Assistant

## 2016-10-02 NOTE — Telephone Encounter (Signed)
Please schedule appt with PCP of patient's choice within the next month.

## 2016-10-03 NOTE — Telephone Encounter (Signed)
MyChart message sent to patient about making appointment for medication refills per Windell Hummingbird

## 2017-05-24 ENCOUNTER — Encounter: Payer: Self-pay | Admitting: Physician Assistant

## 2017-05-24 ENCOUNTER — Ambulatory Visit (INDEPENDENT_AMBULATORY_CARE_PROVIDER_SITE_OTHER): Payer: Managed Care, Other (non HMO) | Admitting: Physician Assistant

## 2017-05-24 VITALS — BP 186/100 | Temp 98.3°F | Resp 18 | Ht 65.71 in | Wt 178.8 lb

## 2017-05-24 DIAGNOSIS — Z1389 Encounter for screening for other disorder: Secondary | ICD-10-CM

## 2017-05-24 DIAGNOSIS — Z0000 Encounter for general adult medical examination without abnormal findings: Secondary | ICD-10-CM

## 2017-05-24 DIAGNOSIS — I10 Essential (primary) hypertension: Secondary | ICD-10-CM

## 2017-05-24 DIAGNOSIS — Z13220 Encounter for screening for lipoid disorders: Secondary | ICD-10-CM

## 2017-05-24 DIAGNOSIS — Z130 Encounter for screening for diseases of the blood and blood-forming organs and certain disorders involving the immune mechanism: Secondary | ICD-10-CM

## 2017-05-24 DIAGNOSIS — Z1329 Encounter for screening for other suspected endocrine disorder: Secondary | ICD-10-CM

## 2017-05-24 LAB — POCT URINALYSIS DIP (MANUAL ENTRY)
BILIRUBIN UA: NEGATIVE
Blood, UA: NEGATIVE
GLUCOSE UA: NEGATIVE mg/dL
Ketones, POC UA: NEGATIVE mg/dL
LEUKOCYTES UA: NEGATIVE
NITRITE UA: NEGATIVE
Protein Ur, POC: NEGATIVE mg/dL
Spec Grav, UA: <=1.005 — AB (ref 1.010–1.025)
UROBILINOGEN UA: 0.2 U/dL
pH, UA: 6 (ref 5.0–8.0)

## 2017-05-24 MED ORDER — LISINOPRIL-HYDROCHLOROTHIAZIDE 20-25 MG PO TABS: 1.0000 | ORAL_TABLET | Freq: Every day | ORAL | 1 refills | Status: DC

## 2017-05-24 NOTE — Progress Notes (Signed)
Carrie Greer  MRN: [id number] DOB: January 27, 1971  Subjective:  Pt is a 46 y.o. female who presents for annual physical exam. Pt is fasting today.   Sleep: gets about 10 hours a night Menstrual cycles: Occur monthly, have decreased in duration to about 3 days, noticing she is having lighter cycles at this point. Has some associated hot flashes. Denies irritiability, vaginal dryness, and sleep distrubance.  Last dental exam: 04/2017, brushes twice daily Last vision exam: Cannot recall Last pap smear: 2017, normal Last mammogram: 2014, normal  Vaccinations      Tetanus: 12/30/2007      Influenza: Never  Chronic conditions:  HTN: Has had dx since 2009. Was controlled on lisinopril-hctz 20-64m daily but due to insurance she could not afford to come back to get medication so has been out of medication for 2 months. Denies chest pain, heart palpitations, SOB,chronic headache, dizziness, and visual disturbances.  Patient Active Problem List   Diagnosis Date Noted  . Essential hypertension 06/24/2014    No current outpatient prescriptions on file prior to visit.   No current facility-administered medications on file prior to visit.     No Known Allergies  Social History   Social History  . Marital status: Married    Spouse name: [name] . Number of children: 7  . Years of education: college   Occupational History  . [employer]  Social History Main Topics  . Smoking status: Never Smoker  . Smokeless tobacco: Never Used  . Alcohol use 0.0 - 1.8 oz/week     Comment: wine on the weekends  . Drug use: No  . Sexual activity: Yes    Partners: Male    Birth control/ protection: Surgical     Comment: BTL 2009 after the birth of triplets, sexually active with monogamous partner   Other Topics Concern  . None   Social History Narrative   [place]   Just graduated in 11/2016 at [employer] with degree in Early Childhood Development.Continuing on now for BA at [employer].   Lives with her husband and their 4 youngest children (triplets and a 139year old). Has 3 other grown children. (7 children total).       Exercise includes walking, stair climbing, playing with the children.       Diet includes baked chicken, fish, potatoes, salads and greens. Likes water and minute maid OJ. Gets enough dairy through milk, cheese, and yogurt.           Past Surgical History:  Procedure Laterality Date  . CESAREAN SECTION    . TUBAL LIGATION      Family History  Problem Relation Age of Onset  . Cancer Mother 564       2005 removed a golf-ball sized tumor from behind her LEFT ear  . Diabetes Mother        lifestyle controlled  . Hypertension Mother   . Hypertension Father     Review of Systems  Constitutional: Negative for activity change, appetite change, chills, diaphoresis, fatigue, fever and unexpected weight change.  HENT: Negative for congestion, dental problem, drooling, ear discharge, ear pain, facial swelling, hearing loss, mouth sores, nosebleeds, postnasal drip, rhinorrhea, sinus pain, sinus pressure, sneezing, sore throat, tinnitus, trouble swallowing and voice change.   Eyes: Negative for photophobia, pain, discharge, redness, itching and visual disturbance.  Respiratory: Negative for apnea, cough, choking, chest  tightness, shortness of breath, wheezing and stridor.   Cardiovascular: Negative for chest pain, palpitations and leg swelling.  Gastrointestinal: Negative for abdominal distention, abdominal pain, anal bleeding, blood in stool, constipation, diarrhea, nausea, rectal pain and vomiting.  Endocrine: Negative for cold intolerance, heat intolerance, polydipsia, polyphagia and polyuria.  Genitourinary: Negative for decreased urine volume, difficulty urinating, dyspareunia, dysuria, enuresis, flank pain, frequency, genital sores, hematuria, menstrual problem, pelvic pain,  urgency, vaginal bleeding, vaginal discharge and vaginal pain.  Musculoskeletal: Positive for arthralgias and back pain. Negative for gait problem, joint swelling, myalgias, neck pain and neck stiffness.  Skin: Negative for color change, pallor, rash and wound.  Allergic/Immunologic: Negative for environmental allergies, food allergies and immunocompromised state.  Neurological: Negative for dizziness, tremors, seizures, syncope, facial asymmetry, speech difficulty, weakness, light-headedness, numbness and headaches.  Hematological: Negative for adenopathy. Does not bruise/bleed easily.  Psychiatric/Behavioral: Negative for agitation, behavioral problems, confusion, decreased concentration, dysphoric mood, hallucinations, self-injury, sleep disturbance and suicidal ideas. The patient is not nervous/anxious and is not hyperactive.     Objective:  BP (!) 186/100 (BP Location: Right Arm, Patient Position: Sitting, Cuff Size: Large)   Temp 98.3 F (36.8 C) (Oral)   Resp 18   Ht 5' 5.71" (1.669 m)   Wt 178 lb 12.8 oz (81.1 kg)   LMP 04/07/2017 (Approximate)   BMI 29.12 kg/m   Physical Exam  Constitutional: She is oriented to person, place, and time and well-developed, well-nourished, and in no distress.  HENT:  Head: Normocephalic and atraumatic.  Right Ear: Hearing, tympanic membrane, external ear and ear canal normal.  Left Ear: Hearing, tympanic membrane, external ear and ear canal normal.  Nose: Nose normal.  Mouth/Throat: Uvula is midline, oropharynx is clear and moist and mucous membranes are normal. No oropharyngeal exudate.  Eyes: Pupils are equal, round, and reactive to light. Conjunctivae, EOM and lids are normal. No scleral icterus.  Neck: Trachea normal and normal range of motion. No thyroid mass and no thyromegaly present.  Cardiovascular: Normal rate, regular rhythm, normal heart sounds and intact distal pulses.   Pulmonary/Chest: Effort normal and breath sounds normal. Right  breast exhibits no inverted nipple, no mass, no nipple discharge, no skin change and no tenderness. Left breast exhibits no inverted nipple, no mass, no nipple discharge, no skin change and no tenderness. Breasts are symmetrical.  Bilateral nipple piercing noted.   Abdominal: Soft. Normal appearance and bowel sounds are normal. There is no tenderness.  Lymphadenopathy:       Head (right side): No tonsillar, no preauricular, no posterior auricular and no occipital adenopathy present.       Head (left side): No tonsillar, no preauricular, no posterior auricular and no occipital adenopathy present.    She has no cervical adenopathy.       Right: No supraclavicular adenopathy present.       Left: No supraclavicular adenopathy present.  Neurological: She is alert and oriented to person, place, and time. She has normal sensation, normal strength and normal reflexes. Gait normal.  Skin: Skin is warm and dry.  Psychiatric: Affect normal.    Visual Acuity Screening   Right eye Left eye Both eyes  Without correction: _0  With correction:      Results for orders placed or performed in visit on 05/24/17 (from the past 24 hour(s))  POCT urinalysis dipstick     Status: Abnormal   Collection Time: 05/24/17 12:56 PM  Result Value Ref Range   Color, UA yellow  yellow   Clarity, UA clear clear   Glucose, UA negative negative mg/dL   Bilirubin, UA negative negative   Ketones, POC UA negative negative mg/dL   Spec Grav, UA <=1.005 (A) 1.010 - 1.025   Blood, UA negative negative   pH, UA 6.0 5.0 - 8.0   Protein Ur, POC negative negative mg/dL   Urobilinogen, UA 0.2 0.2 or 1.0 E.U./dL   Nitrite, UA Negative Negative   Leukocytes, UA Negative Negative     Assessment and Plan :  Discussed healthy lifestyle, diet, exercise, preventative care, vaccinations, and addressed patient's concerns. Plan for follow up in 2 weeks. Otherwise, plan for specific conditions below.  1. Annual physical  exam Await lab results.  Work form completed.  2. Essential hypertension Uncontrolled. Asymptomatic. Instructed to restart BP medication.  Recommend starting half the dose x 1 week and then increase to full dose daily.  Check bp outside of office over the next couple of weeks.  Return in 2 weeks for BP check nurse visit. Given strict ED precautions.  Otherwise return in 6 months for BP follow-up. - CMP14+EGFR - lisinopril-hydrochlorothiazide (PRINZIDE,ZESTORETIC) 20-25 MG tablet; Take 1 tablet by mouth daily.  Dispense: 90 tablet; Refill: 1  3. Screening, anemia, deficiency, iron - CBC with Differential/Platelet  4. Screening, lipid - Lipid panel  5. Screening for thyroid disorder - TSH  6. Screening for hematuria or proteinuria - POCT urinalysis dipstick   Tenna Delaine, PA-C  [facility] 05/24/2017 1:15 PM

## 2017-05-24 NOTE — Patient Instructions (Addendum)
In terms of elevated blood pressure, please start bp medication. Since you have been off the medication for so long, I recommend taking 1/2 tablet for one week then increasing to full tablet daily after that. I would like you to check your blood pressure at least a couple times over the next week outside of the office and document these values. It is best if you check the blood pressure at different times in the day. Your goal is <140/90 and >100/60. Please return to office in 2 weeks for bp repeat only.  If your values are consistently above this goal, please return to office for further evaluation. If you start to have chest pain, blurred vision, shortness of breath, severe headache, lower leg swelling, or nausea/vomiting please seek care immediately here or at the ED.   We should have your lab results in the next week and will contact you with those results.   Good luck with your new job, you are going to do great!    Health Maintenance, Female Adopting a healthy lifestyle and getting preventive care can go a long way to promote health and wellness. Talk with your health care provider about what schedule of regular examinations is right for you. This is a good chance for you to check in with your provider about disease prevention and staying healthy. In between checkups, there are plenty of things you can do on your own. Experts have done a lot of research about which lifestyle changes and preventive measures are most likely to keep you healthy. Ask your health care provider for more information. Weight and diet Eat a healthy diet  Be sure to include plenty of vegetables, fruits, low-fat dairy products, and lean protein.  Do not eat a lot of foods high in solid fats, added sugars, or salt.  Get regular exercise. This is one of the most important things you can do for your health. ? Most adults should exercise for at least 150 minutes each week. The exercise should increase your heart rate and  make you sweat (moderate-intensity exercise). ? Most adults should also do strengthening exercises at least twice a week. This is in addition to the moderate-intensity exercise.  Maintain a healthy weight  Body mass index (BMI) is a measurement that can be used to identify possible weight problems. It estimates body fat based on height and weight. Your health care provider can help determine your BMI and help you achieve or maintain a healthy weight.  For females 81 years of age and older: ? A BMI below 18.5 is considered underweight. ? A BMI of 18.5 to 24.9 is normal. ? A BMI of 25 to 29.9 is considered overweight. ? A BMI of 30 and above is considered obese.  Watch levels of cholesterol and blood lipids  You should start having your blood tested for lipids and cholesterol at 48 years of age, then have this test every 5 years.  You may need to have your cholesterol levels checked more often if: ? Your lipid or cholesterol levels are high. ? You are older than 48 years of age. ? You are at high risk for heart disease.  Cancer screening Lung Cancer  Lung cancer screening is recommended for adults 77-71 years old who are at high risk for lung cancer because of a history of smoking.  A yearly low-dose CT scan of the lungs is recommended for people who: ? Currently smoke. ? Have quit within the past 15 years. ? Have at  least a 30-pack-year history of smoking. A pack year is smoking an average of one pack of cigarettes a day for 1 year.  Yearly screening should continue until it has been 15 years since you quit.  Yearly screening should stop if you develop a health problem that would prevent you from having lung cancer treatment.  Breast Cancer  Practice breast self-awareness. This means understanding how your breasts normally appear and feel.  It also means doing regular breast self-exams. Let your health care provider know about any changes, no matter how small.  If you are in  your 20s or 30s, you should have a clinical breast exam (CBE) by a health care provider every 1-3 years as part of a regular health exam.  If you are 77 or older, have a CBE every year. Also consider having a breast X-ray (mammogram) every year.  If you have a family history of breast cancer, talk to your health care provider about genetic screening.  If you are at high risk for breast cancer, talk to your health care provider about having an MRI and a mammogram every year.  Breast cancer gene (BRCA) assessment is recommended for women who have family members with BRCA-related cancers. BRCA-related cancers include: ? Breast. ? Ovarian. ? Tubal. ? Peritoneal cancers.  Results of the assessment will determine the need for genetic counseling and BRCA1 and BRCA2 testing.  Cervical Cancer Your health care provider may recommend that you be screened regularly for cancer of the pelvic organs (ovaries, uterus, and vagina). This screening involves a pelvic examination, including checking for microscopic changes to the surface of your cervix (Pap test). You may be encouraged to have this screening done every 3 years, beginning at age 25.  For women ages 20-65, health care providers may recommend pelvic exams and Pap testing every 3 years, or they may recommend the Pap and pelvic exam, combined with testing for human papilloma virus (HPV), every 5 years. Some types of HPV increase your risk of cervical cancer. Testing for HPV may also be done on women of any age with unclear Pap test results.  Other health care providers may not recommend any screening for nonpregnant women who are considered low risk for pelvic cancer and who do not have symptoms. Ask your health care provider if a screening pelvic exam is right for you.  If you have had past treatment for cervical cancer or a condition that could lead to cancer, you need Pap tests and screening for cancer for at least 20 years after your treatment. If  Pap tests have been discontinued, your risk factors (such as having a new sexual partner) need to be reassessed to determine if screening should resume. Some women have medical problems that increase the chance of getting cervical cancer. In these cases, your health care provider may recommend more frequent screening and Pap tests.  Colorectal Cancer  This type of cancer can be detected and often prevented.  Routine colorectal cancer screening usually begins at 48 years of age and continues through 48 years of age.  Your health care provider may recommend screening at an earlier age if you have risk factors for colon cancer.  Your health care provider may also recommend using home test kits to check for hidden blood in the stool.  A small camera at the end of a tube can be used to examine your colon directly (sigmoidoscopy or colonoscopy). This is done to check for the earliest forms of colorectal cancer.  Routine screening usually begins at age 40.  Direct examination of the colon should be repeated every 5-10 years through 48 years of age. However, you may need to be screened more often if early forms of precancerous polyps or small growths are found.  Skin Cancer  Check your skin from head to toe regularly.  Tell your health care provider about any new moles or changes in moles, especially if there is a change in a mole's shape or color.  Also tell your health care provider if you have a mole that is larger than the size of a pencil eraser.  Always use sunscreen. Apply sunscreen liberally and repeatedly throughout the day.  Protect yourself by wearing long sleeves, pants, a wide-brimmed hat, and sunglasses whenever you are outside.  Heart disease, diabetes, and high blood pressure  High blood pressure causes heart disease and increases the risk of stroke. High blood pressure is more likely to develop in: ? People who have blood pressure in the high end of the normal range  (130-139/85-89 mm Hg). ? People who are overweight or obese. ? People who are African American.  If you are 61-46 years of age, have your blood pressure checked every 3-5 years. If you are 7 years of age or older, have your blood pressure checked every year. You should have your blood pressure measured twice-once when you are at a hospital or clinic, and once when you are not at a hospital or clinic. Record the average of the two measurements. To check your blood pressure when you are not at a hospital or clinic, you can use: ? An automated blood pressure machine at a pharmacy. ? A home blood pressure monitor.  If you are between 64 years and 44 years old, ask your health care provider if you should take aspirin to prevent strokes.  Have regular diabetes screenings. This involves taking a blood sample to check your fasting blood sugar level. ? If you are at a normal weight and have a low risk for diabetes, have this test once every three years after 48 years of age. ? If you are overweight and have a high risk for diabetes, consider being tested at a younger age or more often. Preventing infection Hepatitis B  If you have a higher risk for hepatitis B, you should be screened for this virus. You are considered at high risk for hepatitis B if: ? You were born in a country where hepatitis B is common. Ask your health care provider which countries are considered high risk. ? Your parents were born in a high-risk country, and you have not been immunized against hepatitis B (hepatitis B vaccine). ? You have HIV or AIDS. ? You use needles to inject street drugs. ? You live with someone who has hepatitis B. ? You have had sex with someone who has hepatitis B. ? You get hemodialysis treatment. ? You take certain medicines for conditions, including cancer, organ transplantation, and autoimmune conditions.  Hepatitis C  Blood testing is recommended for: ? Everyone born from [birth year] through  1967. ? Anyone with known risk factors for hepatitis C.  Sexually transmitted infections (STIs)  You should be screened for sexually transmitted infections (STIs) including gonorrhea and chlamydia if: ? You are sexually active and are younger than 48 years of age. ? You are older than 48 years of age and your health care provider tells you that you are at risk for this type of infection. ? Your sexual activity  has changed since you were last screened and you are at an increased risk for chlamydia or gonorrhea. Ask your health care provider if you are at risk.  If you do not have HIV, but are at risk, it may be recommended that you take a prescription medicine daily to prevent HIV infection. This is called pre-exposure prophylaxis (PrEP). You are considered at risk if: ? You are sexually active and do not regularly use condoms or know the HIV status of your partner(s). ? You take drugs by injection. ? You are sexually active with a partner who has HIV.  Talk with your health care provider about whether you are at high risk of being infected with HIV. If you choose to begin PrEP, you should first be tested for HIV. You should then be tested every 3 months for as long as you are taking PrEP. Pregnancy  If you are premenopausal and you may become pregnant, ask your health care provider about preconception counseling.  If you may become pregnant, take 400 to 800 micrograms (mcg) of folic acid every day.  If you want to prevent pregnancy, talk to your health care provider about birth control (contraception). Osteoporosis and menopause  Osteoporosis is a disease in which the bones lose minerals and strength with aging. This can result in serious bone fractures. Your risk for osteoporosis can be identified using a bone density scan.  If you are 62 years of age or older, or if you are at risk for osteoporosis and fractures, ask your health care provider if you should be screened.  Ask your health  care provider whether you should take a calcium or vitamin D supplement to lower your risk for osteoporosis.  Menopause may have certain physical symptoms and risks.  Hormone replacement therapy may reduce some of these symptoms and risks. Talk to your health care provider about whether hormone replacement therapy is right for you. Follow these instructions at home:  Schedule regular health, dental, and eye exams.  Stay current with your immunizations.  Do not use any tobacco products including cigarettes, chewing tobacco, or electronic cigarettes.  If you are pregnant, do not drink alcohol.  If you are breastfeeding, limit how much and how often you drink alcohol.  Limit alcohol intake to no more than 1 drink per day for nonpregnant women. One drink equals 12 ounces of beer, 5 ounces of wine, or 1 ounces of hard liquor.  Do not use street drugs.  Do not share needles.  Ask your health care provider for help if you need support or information about quitting drugs.  Tell your health care provider if you often feel depressed.  Tell your health care provider if you have ever been abused or do not feel safe at home. This information is not intended to replace advice given to you by your health care provider. Make sure you discuss any questions you have with your health care provider. Document Released: 01/30/2011 Document Revised: 12/23/2015 Document Reviewed: 04/20/2015 Elsevier Interactive Patient Education  2018 [employer].     IF you received an x-ray today, you will receive an invoice from [facility]. Please contact [facility] at [phone] with questions or concerns regarding your invoice.   IF you received labwork today, you will receive an invoice from [place]. Please contact [facility] at [phone] with questions or concerns regarding your invoice.   Our billing staff will not be able to assist you with questions regarding bills from these  companies.  You  will be contacted with the lab results as soon as they are available. The fastest way to get your results is to activate your My Chart account. Instructions are located on the last page of this paperwork. If you have not heard from [place] regarding the results in 2 weeks, please contact this office.

## 2017-05-25 LAB — CMP14+EGFR
ALBUMIN: 5.3 g/dL (ref 3.5–5.5)
ALK PHOS: 75 IU/L (ref 39–117)
ALT: 13 IU/L (ref 0–32)
AST: 13 IU/L (ref 0–40)
Albumin/Globulin Ratio: 1.5 (ref 1.2–2.2)
BILIRUBIN TOTAL: 0.9 mg/dL (ref 0.0–1.2)
BUN / CREAT RATIO: 12 (ref 9–23)
BUN: 11 mg/dL (ref 6–24)
CHLORIDE: 98 mmol/L (ref 96–106)
CO2: 23 mmol/L (ref 20–29)
Calcium: 10.4 mg/dL — ABNORMAL HIGH (ref 8.7–10.2)
Creatinine, Ser: 0.94 mg/dL (ref 0.57–1.00)
GFR calc Af Amer: 84 mL/min/{1.73_m2} (ref >59)
GFR calc non Af Amer: 72 mL/min/{1.73_m2} (ref >59)
GLUCOSE: 138 mg/dL — AB (ref 65–99)
Globulin, Total: 3.6 g/dL (ref 1.5–4.5)
Potassium: 3.3 mmol/L — ABNORMAL LOW (ref 3.5–5.2)
Sodium: 144 mmol/L (ref 134–144)
Total Protein: 8.9 g/dL — ABNORMAL HIGH (ref 6.0–8.5)

## 2017-05-25 LAB — CBC WITH DIFFERENTIAL/PLATELET
BASOS ABS: 0 10*3/uL (ref 0.0–0.2)
Basos: 0 %
EOS (ABSOLUTE): 0 10*3/uL (ref 0.0–0.4)
Eos: 0 %
HEMOGLOBIN: 15.4 g/dL (ref 11.1–15.9)
Hematocrit: 46.2 % (ref 34.0–46.6)
Immature Grans (Abs): 0 10*3/uL (ref 0.0–0.1)
Immature Granulocytes: 0 %
LYMPHS ABS: 1.1 10*3/uL (ref 0.7–3.1)
LYMPHS: 12 %
MCH: 30.3 pg (ref 26.6–33.0)
MCHC: 33.3 g/dL (ref 31.5–35.7)
MCV: 91 fL (ref 79–97)
MONOCYTES: 3 %
MONOS ABS: 0.3 10*3/uL (ref 0.1–0.9)
NEUTROS ABS: 8.1 10*3/uL — AB (ref 1.4–7.0)
NEUTROS PCT: 85 %
PLATELETS: 138 10*3/uL — AB (ref 150–379)
RBC: 5.08 x10E6/uL (ref 3.77–5.28)
RDW: 13.9 % (ref 12.3–15.4)
WBC: 9.6 10*3/uL (ref 3.4–10.8)

## 2017-05-25 LAB — LIPID PANEL
CHOLESTEROL TOTAL: 257 mg/dL — AB (ref 100–199)
Chol/HDL Ratio: 3.7 ratio (ref 0.0–4.4)
HDL: 69 mg/dL (ref >39)
LDL Calculated: 173 mg/dL — ABNORMAL HIGH (ref 0–99)
TRIGLYCERIDES: 75 mg/dL (ref 0–149)
VLDL CHOLESTEROL CAL: 15 mg/dL (ref 5–40)

## 2017-05-25 LAB — TSH: TSH: 1.43 u[IU]/mL (ref 0.450–4.500)

## 2017-06-01 ENCOUNTER — Other Ambulatory Visit: Payer: Self-pay | Admitting: Physician Assistant

## 2017-06-01 DIAGNOSIS — R7301 Impaired fasting glucose: Secondary | ICD-10-CM

## 2017-06-01 NOTE — Progress Notes (Signed)
Orders Placed This Encounter  Procedures  . Hemoglobin A1c    Standing Status:   Future    Standing Expiration Date:   06/15/2017

## 2017-06-08 ENCOUNTER — Ambulatory Visit: Payer: Managed Care, Other (non HMO) | Admitting: Physician Assistant

## 2017-06-08 ENCOUNTER — Telehealth: Payer: Self-pay

## 2017-06-08 DIAGNOSIS — R7301 Impaired fasting glucose: Secondary | ICD-10-CM

## 2017-06-09 LAB — HEMOGLOBIN A1C
Est. average glucose Bld gHb Est-mCnc: 134 mg/dL
HEMOGLOBIN A1C: 6.3 % — AB (ref 4.8–5.6)

## 2017-06-12 ENCOUNTER — Ambulatory Visit: Payer: Managed Care, Other (non HMO) | Admitting: Physician Assistant

## 2017-06-24 NOTE — Progress Notes (Signed)
Lab only 

## 2017-07-27 NOTE — Telephone Encounter (Signed)
Error-Close Encounter 

## 2017-08-28 ENCOUNTER — Ambulatory Visit: Payer: Managed Care, Other (non HMO)

## 2017-08-30 ENCOUNTER — Ambulatory Visit: Payer: Managed Care, Other (non HMO) | Admitting: Physician Assistant

## 2017-08-30 DIAGNOSIS — Z111 Encounter for screening for respiratory tuberculosis: Secondary | ICD-10-CM

## 2017-08-30 NOTE — Progress Notes (Unsigned)
  Tuberculosis Risk Questionnaire  1. No Were you born outside the [place] in one of the following parts of the world: [place], [place], [place], [place] or [place]?    2. No Have you traveled outside the [place] and lived for more than one month in one of the following parts of the world: [place], [place], [place], [place] or [place]?    3. No Do you have a compromised immune system such as from any of the following conditions:HIV/AIDS, organ or bone marrow transplantation, diabetes, immunosuppressive medicines (e.g. Prednisone, Remicaide), leukemia, lymphoma, cancer of the head or neck, gastrectomy or jejunal bypass, end-stage renal disease (on dialysis), or silicosis?     4. No Have you ever or do you plan on working in: a residential care center, a health care facility, a jail or prison or homeless shelter?    5. No Have you ever: injected illegal drugs, used crack cocaine, lived in a homeless shelter  or been in jail or prison?     6. No Have you ever been exposed to anyone with infectious tuberculosis?  7. No Have you ever had a BCG vaccine? (BCG is a vaccine for tuberculosis  (TB) used in OTHER countries, NOT in the [place]).  8. No Have you ever been advised by a health care provider NOT to have a TB skin test?  9. No Have you ever had a POSITIVE TB skin test?  IF SO, when? n/a  IF SO, were you treated with INH? {NA AND MNOTRRNH:65790}  IF SO, where? {NA AND XYBFXOVA:91916}  Tuberculosis Symptom Questionnaire  Do you currently have any of the following symptoms?  1. No Unexplained cough lasting more than 3 weeks?   2. No Unexplained fever lasting more than 3 weeks.   3. No Night Sweats (sweating that leaves the bedclothes and sheets wet)     4. No Shortness of Breath   5. No Chest Pain   6. No Unintentional weight loss    7. No Unexplained fatigue (very tired for no reason)

## 2017-09-03 ENCOUNTER — Ambulatory Visit (INDEPENDENT_AMBULATORY_CARE_PROVIDER_SITE_OTHER): Payer: Managed Care, Other (non HMO) | Admitting: Physician Assistant

## 2017-09-03 DIAGNOSIS — Z111 Encounter for screening for respiratory tuberculosis: Secondary | ICD-10-CM

## 2017-09-03 NOTE — Progress Notes (Signed)

## 2017-09-05 ENCOUNTER — Ambulatory Visit: Payer: Managed Care, Other (non HMO)

## 2017-09-05 DIAGNOSIS — Z111 Encounter for screening for respiratory tuberculosis: Secondary | ICD-10-CM

## 2017-09-05 LAB — TB SKIN TEST
INDURATION: 0 mm
TB Skin Test: NEGATIVE

## 2017-09-05 NOTE — Progress Notes (Signed)
PPD Reading Note PPD read and results entered in [place]. Result: 0 mm induration. Interpretation: negative If test not read within 48-72 hours of initial placement, patient advised to repeat in other arm 1-3 weeks after this test. Allergic reaction: erythema noted at ppd placement site

## 2017-12-12 ENCOUNTER — Ambulatory Visit: Payer: Managed Care, Other (non HMO) | Admitting: Physician Assistant

## 2017-12-12 ENCOUNTER — Encounter: Payer: Self-pay | Admitting: Physician Assistant

## 2017-12-12 ENCOUNTER — Other Ambulatory Visit: Payer: Self-pay

## 2017-12-12 VITALS — BP 160/94 | HR 88 | Temp 98.7°F | Resp 18 | Ht 65.28 in | Wt 176.2 lb

## 2017-12-12 DIAGNOSIS — J22 Unspecified acute lower respiratory infection: Secondary | ICD-10-CM

## 2017-12-12 DIAGNOSIS — I10 Essential (primary) hypertension: Secondary | ICD-10-CM | POA: Diagnosis not present

## 2017-12-12 LAB — POCT URINALYSIS DIP (MANUAL ENTRY)
BILIRUBIN UA: NEGATIVE mg/dL
Bilirubin, UA: NEGATIVE
Glucose, UA: NEGATIVE mg/dL
Leukocytes, UA: NEGATIVE
Nitrite, UA: NEGATIVE
PH UA: 7 (ref 5.0–8.0)
PROTEIN UA: NEGATIVE mg/dL
SPEC GRAV UA: 1.015 (ref 1.010–1.025)
Urobilinogen, UA: 0.2 E.U./dL

## 2017-12-12 MED ORDER — IPRATROPIUM BROMIDE 0.03 % NA SOLN: 2.0000 | Freq: Two times a day (BID) | NASAL | 0 refills | Status: DC

## 2017-12-12 MED ORDER — BENZONATATE 100 MG PO CAPS: 100.0000 mg | ORAL_CAPSULE | Freq: Three times a day (TID) | ORAL | 0 refills | Status: DC | PRN

## 2017-12-12 MED ORDER — GUAIFENESIN ER 1200 MG PO TB12: 1.0000 | ORAL_TABLET | Freq: Two times a day (BID) | ORAL | 1 refills | Status: DC | PRN

## 2017-12-12 MED ORDER — AMLODIPINE BESYLATE 5 MG PO TABS: 2.5000 mg | ORAL_TABLET | Freq: Every day | ORAL | 0 refills | Status: DC

## 2017-12-12 MED ORDER — LISINOPRIL-HYDROCHLOROTHIAZIDE 20-25 MG PO TABS: 1.0000 | ORAL_TABLET | Freq: Every day | ORAL | 0 refills | Status: DC

## 2017-12-12 NOTE — Patient Instructions (Addendum)
In terms of elevated blood pressure, I would like you to start amlodipine 2.5mg  along with your lisinopril-hctz tablet.  I would like you to check your blood pressure at least a couple times over the next week outside of the office and document these values. It is best if you check the blood pressure at different times in the day. Your goal is <140/90 and >100/60.Return to office in 2 weeks for reevaluation. If you start to have chest pain, blurred vision, shortness of breath, severe headache, lower leg swelling, or nausea/vomiting please seek care immediately here or at the ED.  Bring home blood pressure cuff to next visit.   - We will treat this as a respiratory viral infection.  - I recommend you rest, drink plenty of fluids, eat light meals including soups.  - You may use mucinex if you want to help bring stuff up and Tessalon pearls if you want to stop the cough during the day. - You may also use Tylenol or ibuprofen over-the-counter for your cough. Tea recipe: boil water, add 2 inches shaved ginger root, steep 15 minutes, add juice from 2 full lemons, and 2 tbsp honey. -Use atrovent nasal spray for runny nose. - Please let me know if you are not seeing any improvement or get worse in 5-7 days.    Managing Your Hypertension Hypertension is commonly called high blood pressure. This is when the force of your blood pressing against the walls of your arteries is too strong. Arteries are blood vessels that carry blood from your heart throughout your body. Hypertension forces the heart to work harder to pump blood, and may cause the arteries to become narrow or stiff. Having untreated or uncontrolled hypertension can cause heart attack, stroke, kidney disease, and other problems. What are blood pressure readings? A blood pressure reading consists of a higher number over a lower number. Ideally, your blood pressure should be below 120/80. The first ("top") number is called the systolic pressure. It is a  measure of the pressure in your arteries as your heart beats. The second ("bottom") number is called the diastolic pressure. It is a measure of the pressure in your arteries as the heart relaxes. What does my blood pressure reading mean? Blood pressure is classified into four stages. Based on your blood pressure reading, your health care provider may use the following stages to determine what type of treatment you need, if any. Systolic pressure and diastolic pressure are measured in a unit called mm Hg. Normal  Systolic pressure: below 751.  Diastolic pressure: below 80. Elevated  Systolic pressure: 700-174.  Diastolic pressure: below 80. Hypertension stage 1  Systolic pressure: 944-967.  Diastolic pressure: 59-16. Hypertension stage 2  Systolic pressure: 384 or above.  Diastolic pressure: 90 or above. What health risks are associated with hypertension? Managing your hypertension is an important responsibility. Uncontrolled hypertension can lead to:  A heart attack.  A stroke.  A weakened blood vessel (aneurysm).  Heart failure.  Kidney damage.  Eye damage.  Metabolic syndrome.  Memory and concentration problems.  What changes can I make to manage my hypertension? Hypertension can be managed by making lifestyle changes and possibly by taking medicines. Your health care provider will help you make a plan to bring your blood pressure within a normal range. Eating and drinking  Eat a diet that is high in fiber and potassium, and low in salt (sodium), added sugar, and fat. An example eating plan is called the DASH (Dietary Approaches  to Stop Hypertension) diet. To eat this way: ? Eat plenty of fresh fruits and vegetables. Try to fill half of your plate at each meal with fruits and vegetables. ? Eat whole grains, such as whole wheat pasta, brown rice, or whole grain bread. Fill about one quarter of your plate with whole grains. ? Eat low-fat diary products. ? Avoid fatty  cuts of meat, processed or cured meats, and poultry with skin. Fill about one quarter of your plate with lean proteins such as fish, chicken without skin, beans, eggs, and tofu. ? Avoid premade and processed foods. These tend to be higher in sodium, added sugar, and fat.  Reduce your daily sodium intake. Most people with hypertension should eat less than 1,500 mg of sodium a day.  Limit alcohol intake to no more than 1 drink a day for nonpregnant women and 2 drinks a day for men. One drink equals 12 oz of beer, 5 oz of wine, or 1 oz of hard liquor. Lifestyle  Work with your health care provider to maintain a healthy body weight, or to lose weight. Ask what an ideal weight is for you.  Get at least 30 minutes of exercise that causes your heart to beat faster (aerobic exercise) most days of the week. Activities may include walking, swimming, or biking.  Include exercise to strengthen your muscles (resistance exercise), such as weight lifting, as part of your weekly exercise routine. Try to do these types of exercises for 30 minutes at least 3 days a week.  Do not use any products that contain nicotine or tobacco, such as cigarettes and e-cigarettes. If you need help quitting, ask your health care provider.  Control any long-term (chronic) conditions you have, such as high cholesterol or diabetes. Monitoring  Monitor your blood pressure at home as told by your health care provider. Your personal target blood pressure may vary depending on your medical conditions, your age, and other factors.  Have your blood pressure checked regularly, as often as told by your health care provider. Working with your health care provider  Review all the medicines you take with your health care provider because there may be side effects or interactions.  Talk with your health care provider about your diet, exercise habits, and other lifestyle factors that may be contributing to hypertension.  Visit your  health care provider regularly. Your health care provider can help you create and adjust your plan for managing hypertension. Will I need medicine to control my blood pressure? Your health care provider may prescribe medicine if lifestyle changes are not enough to get your blood pressure under control, and if:  Your systolic blood pressure is 130 or higher.  Your diastolic blood pressure is 80 or higher.  Take medicines only as told by your health care provider. Follow the directions carefully. Blood pressure medicines must be taken as prescribed. The medicine does not work as well when you skip doses. Skipping doses also puts you at risk for problems. Contact a health care provider if:  You think you are having a reaction to medicines you have taken.  You have repeated (recurrent) headaches.  You feel dizzy.  You have swelling in your ankles.  You have trouble with your vision. Get help right away if:  You develop a severe headache or confusion.  You have unusual weakness or numbness, or you feel faint.  You have severe pain in your chest or abdomen.  You vomit repeatedly.  You have trouble breathing. Summary  Hypertension is when the force of blood pumping through your arteries is too strong. If this condition is not controlled, it may put you at risk for serious complications.  Your personal target blood pressure may vary depending on your medical conditions, your age, and other factors. For most people, a normal blood pressure is less than 120/80.  Hypertension is managed by lifestyle changes, medicines, or both. Lifestyle changes include weight loss, eating a healthy, low-sodium diet, exercising more, and limiting alcohol. This information is not intended to replace advice given to you by your health care provider. Make sure you discuss any questions you have with your health care provider. Document Released: 04/10/2012 Document Revised: 06/14/2016 Document Reviewed:  06/14/2016 Elsevier Interactive Patient Education  2018 [place].  Upper Respiratory Infection, Adult Most upper respiratory infections (URIs) are caused by a virus. A URI affects the nose, throat, and upper air passages. The most common type of URI is often called "the common cold." Follow these instructions at home:  Take medicines only as told by your doctor.  Gargle warm saltwater or take cough drops to comfort your throat as told by your doctor.  Use a warm mist humidifier or inhale steam from a shower to increase air moisture. This may make it easier to breathe.  Drink enough fluid to keep your pee (urine) clear or pale yellow.  Eat soups and other clear broths.  Have a healthy diet.  Rest as needed.  Go back to work when your fever is gone or your doctor says it is okay. ? You may need to stay home longer to avoid giving your URI to others. ? You can also wear a face mask and wash your hands often to prevent spread of the virus.  Use your inhaler more if you have asthma.  Do not use any tobacco products, including cigarettes, chewing tobacco, or electronic cigarettes. If you need help quitting, ask your doctor. Contact a doctor if:  You are getting worse, not better.  Your symptoms are not helped by medicine.  You have chills.  You are getting more short of breath.  You have brown or red mucus.  You have yellow or brown discharge from your nose.  You have pain in your face, especially when you bend forward.  You have a fever.  You have puffy (swollen) neck glands.  You have pain while swallowing.  You have white areas in the back of your throat. Get help right away if:  You have very bad or constant: ? Headache. ? Ear pain. ? Pain in your forehead, behind your eyes, and over your cheekbones (sinus pain). ? Chest pain.  You have long-lasting (chronic) lung disease and any of the following: ? Wheezing. ? Long-lasting cough. ? Coughing up  blood. ? A change in your usual mucus.  You have a stiff neck.  You have changes in your: ? Vision. ? Hearing. ? Thinking. ? Mood. This information is not intended to replace advice given to you by your health care provider. Make sure you discuss any questions you have with your health care provider. Document Released: 01/03/2008 Document Revised: 03/19/2016 Document Reviewed: 10/22/2013 Elsevier Interactive Patient Education  2018 [employer].   IF you received an x-ray today, you will receive an invoice from [facility]. Please contact [facility] at [phone] with questions or concerns regarding your invoice.   IF you received labwork today, you will receive an invoice from [facility]. Please contact [facility] at [phone] with questions  or concerns regarding your invoice.   Our billing staff will not be able to assist you with questions regarding bills from these companies.  You will be contacted with the lab results as soon as they are available. The fastest way to get your results is to activate your My Chart account. Instructions are located on the last page of this paperwork. If you have not heard from [place] regarding the results in 2 weeks, please contact this office.

## 2017-12-12 NOTE — Progress Notes (Signed)
MRN: [id number] DOB: 08/10/1970  Subjective:   Carrie Greer is a 47 y.o. female presenting for follow up on Hypertension.  Has had diagnosis since 2009.  Last seen in office on 05/24/17 and had been out of bp meds for a couple of weeks. Returned once for in office bp check. Currently managed with lisinopril-hctz 20-23m. Patient is checking blood pressure at home, range is high [id number]. Denies lightheadedness, dizziness, chronic headache, double vision, chest pain, shortness of breath, heart racing, palpitations, nausea, vomiting, abdominal pain, hematuria, lower leg swelling. 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 Coaches basketball. Eats a lot of baked/grilled meat, vegetables, fruits, and rice. Avoiding excessive salt intake.  Denies smoking.  Also been having dry cough and nasal congestion x 4 days. Has tried vicks and tea with some relief.  Sx improving. Denies fever, chills, shortness of breath, wheezing, sinus pain, ear pain, sore throat, headache.  No history of allergies.  Has history of asthma as a child but no issues as an adult.  No other questions or concerns today.   Carrie Greer a current medication list which includes the following prescription(s): lisinopril-hydrochlorothiazide, amlodipine, benzonatate, guaifenesin, and ipratropium. Also has No Known Allergies.  Carrie Greer has a past medical history of Gestational diabetes (2009) and Hypertension. Also  has a past surgical history that includes Tubal ligation and Cesarean section.   Objective:   Vitals: BP (!) 160/94 (BP Location: Left Arm, Patient Position: Sitting, Cuff Size: Normal)   Pulse 88   Temp 98.7 F (37.1 C) (Oral)   Resp 18   Ht 5' 5.28" (1.658 m)   Wt 176 lb 3.2 oz (79.9 kg)   LMP 11/07/2017 (Exact Date)   SpO2 100%   BMI 29.07 kg/m   Physical Exam  Constitutional: She is oriented to person, place, and time. She appears well-developed and well-nourished. No  distress.  HENT:  Head: Normocephalic and atraumatic.  Nose: Mucosal edema (moderate bilaterally) and rhinorrhea present. Right sinus exhibits no maxillary sinus tenderness and no frontal sinus tenderness. Left sinus exhibits no maxillary sinus tenderness and no frontal sinus tenderness.  Mouth/Throat: Uvula is midline, oropharynx is clear and moist and mucous membranes are normal.  Eyes: Pupils are equal, round, and reactive to light. Conjunctivae and EOM are normal.  Neck: Normal range of motion.  Cardiovascular: Normal rate, regular rhythm, normal heart sounds and intact distal pulses.  Pulmonary/Chest: Effort normal. She has no wheezes. She has rhonchi (few rhonchi auscultated in right posterior lung field, cleared with cough). She has no rales.  Musculoskeletal:       Right lower leg: She exhibits no swelling.       Left lower leg: She exhibits no swelling.  Lymphadenopathy:       Head (right side): No submental, no submandibular, no tonsillar, no preauricular, no posterior auricular and no occipital adenopathy present.       Head (left side): No submental, no submandibular, no tonsillar, no preauricular, no posterior auricular and no occipital adenopathy present.    She has no cervical adenopathy.       Right: No supraclavicular adenopathy present.       Left: No supraclavicular adenopathy present.  Neurological: She is alert and oriented to person, place, and time.  Skin: Skin is warm and dry.  Psychiatric: She has a normal mood and affect.  Vitals reviewed.     BP Readings from Last 3 Encounters:  12/12/17 (!) 160/94  06/08/17 (!) 158/98  05/24/17 (!) 186/100    Results for orders placed or performed in visit on 12/12/17 (from the past 24 hour(s))  CMP14+EGFR     Status: None   Collection Time: 12/12/17  5:40 PM  Result Value Ref Range   Glucose 93 65 - 99 mg/dL   BUN 20 6 - 24 mg/dL   Creatinine, Ser 0.91 0.57 - 1.00 mg/dL   GFR calc non Af Amer 75 >59 mL/min/1.73    GFR calc Af Amer 86 >59 mL/min/1.73   BUN/Creatinine Ratio 22 9 - 23   Sodium 142 134 - 144 mmol/L   Potassium 3.6 3.5 - 5.2 mmol/L   Chloride 97 96 - 106 mmol/L   CO2 27 20 - 29 mmol/L   Calcium 9.8 8.7 - 10.2 mg/dL   Total Protein 7.5 6.0 - 8.5 g/dL   Albumin 4.5 3.5 - 5.5 g/dL   Globulin, Total 3.0 1.5 - 4.5 g/dL   Albumin/Globulin Ratio 1.5 1.2 - 2.2   Bilirubin Total 0.3 0.0 - 1.2 mg/dL   Alkaline Phosphatase 54 39 - 117 IU/L   AST 17 0 - 40 IU/L   ALT 15 0 - 32 IU/L   Narrative   Performed at:  [facility] [address] [place], [place]  [id number] Lab Director: Rush Farmer MD, Phone:  [phone]  POCT urinalysis dipstick     Status: Abnormal   Collection Time: 12/12/17  6:14 PM  Result Value Ref Range   Color, UA yellow yellow   Clarity, UA clear clear   Glucose, UA negative negative mg/dL   Bilirubin, UA negative negative   Ketones, POC UA negative negative mg/dL   Spec Grav, UA 1.015 1.010 - 1.025   Blood, UA moderate (A) negative   pH, UA 7.0 5.0 - 8.0   Protein Ur, POC negative negative mg/dL   Urobilinogen, UA 0.2 0.2 or 1.0 E.U./dL   Nitrite, UA Negative Negative   Leukocytes, UA Negative Negative    Wt Readings from Last 3 Encounters:  12/12/17 176 lb 3.2 oz (79.9 kg)  05/24/17 178 lb 12.8 oz (81.1 kg)  02/18/16 173 lb (78.5 kg)   EKG shows NSR with rate of 70 bpm. PR and QRS intervals within normal limits. No acute changes noted from prior EKG of 07/2014.   Assessment and Plan :  1. Essential hypertension Uncontrolled at this time. Pt's home recordings are better than in office readings but still not at goal of <130/80. She is asymptomatic. EKG normal. Will add amlodipine 2.66m at this time. Instructed to check bp outside of office over the next couple of weeks and document these values. Return in 2 weeks for reevaluation. Bring home bp cuff to visit so we can compare to in office values.  Given strict ED precautions.  - CMP14+EGFR - POCT  urinalysis dipstick - EKG 12-Lead - amLODipine (NORVASC) 5 MG tablet; Take 0.5 tablets (2.5 mg total) by mouth daily.  Dispense: 90 tablet; Refill: 0 - lisinopril-hydrochlorothiazide (PRINZIDE,ZESTORETIC) 20-25 MG tablet; Take 1 tablet by mouth daily.  Dispense: 90 tablet; Refill: 0  2. Acute respiratory infection - Likely viral in etiology d/t reassuring physical exam findings. Vitals stable. - Advised supportive care, offered symptomatic relief. - Return to clinic if symptoms worsen or fail to improve in 5-7 days, otherwise return to clinic as needed. - ipratropium (ATROVENT) 0.03 % nasal spray; Place 2 sprays into both nostrils 2 (two) times daily.  Dispense: 30 mL; Refill:  0 - benzonatate (TESSALON) 100 MG capsule; Take 1-2 capsules (100-200 mg total) by mouth 3 (three) times daily as needed for cough.  Dispense: 40 capsule; Refill: 0 - Guaifenesin (MUCINEX MAXIMUM STRENGTH) 1200 MG TB12; Take 1 tablet (1,200 mg total) by mouth every 12 (twelve) hours as needed.  Dispense: 14 tablet; Refill: [place], PA-C  [facility] [place] 12/13/2017 6:47 AM

## 2017-12-13 LAB — CMP14+EGFR
A/G RATIO: 1.5 (ref 1.2–2.2)
ALBUMIN: 4.5 g/dL (ref 3.5–5.5)
ALT: 15 IU/L (ref 0–32)
AST: 17 IU/L (ref 0–40)
Alkaline Phosphatase: 54 IU/L (ref 39–117)
BILIRUBIN TOTAL: 0.3 mg/dL (ref 0.0–1.2)
BUN / CREAT RATIO: 22 (ref 9–23)
BUN: 20 mg/dL (ref 6–24)
CHLORIDE: 97 mmol/L (ref 96–106)
CO2: 27 mmol/L (ref 20–29)
Calcium: 9.8 mg/dL (ref 8.7–10.2)
Creatinine, Ser: 0.91 mg/dL (ref 0.57–1.00)
GFR calc non Af Amer: 75 mL/min/{1.73_m2} (ref >59)
GFR, EST AFRICAN AMERICAN: 86 mL/min/{1.73_m2} (ref >59)
GLOBULIN, TOTAL: 3 g/dL (ref 1.5–4.5)
Glucose: 93 mg/dL (ref 65–99)
Potassium: 3.6 mmol/L (ref 3.5–5.2)
Sodium: 142 mmol/L (ref 134–144)
TOTAL PROTEIN: 7.5 g/dL (ref 6.0–8.5)

## 2017-12-27 ENCOUNTER — Ambulatory Visit: Payer: Managed Care, Other (non HMO) | Admitting: Physician Assistant

## 2017-12-27 ENCOUNTER — Encounter: Payer: Self-pay | Admitting: Physician Assistant

## 2017-12-27 ENCOUNTER — Other Ambulatory Visit: Payer: Self-pay

## 2017-12-27 VITALS — BP 138/81 | HR 78 | Temp 98.4°F | Resp 18 | Ht 64.84 in | Wt 172.8 lb

## 2017-12-27 DIAGNOSIS — R8290 Unspecified abnormal findings in urine: Secondary | ICD-10-CM | POA: Diagnosis not present

## 2017-12-27 DIAGNOSIS — I10 Essential (primary) hypertension: Secondary | ICD-10-CM | POA: Diagnosis not present

## 2017-12-27 LAB — POCT URINALYSIS DIP (MANUAL ENTRY)
BILIRUBIN UA: NEGATIVE
BILIRUBIN UA: NEGATIVE mg/dL
GLUCOSE UA: NEGATIVE mg/dL
Leukocytes, UA: NEGATIVE
Nitrite, UA: NEGATIVE
PROTEIN UA: NEGATIVE mg/dL
Spec Grav, UA: 1.02 (ref 1.010–1.025)
Urobilinogen, UA: 0.2 E.U./dL
pH, UA: 6 (ref 5.0–8.0)

## 2017-12-27 LAB — POC MICROSCOPIC URINALYSIS (UMFC): MUCUS RE: ABSENT

## 2017-12-27 MED ORDER — AMLODIPINE BESYLATE 5 MG PO TABS: 2.5000 mg | ORAL_TABLET | Freq: Every day | ORAL | 0 refills | Status: DC

## 2017-12-27 MED ORDER — LISINOPRIL-HYDROCHLOROTHIAZIDE 20-25 MG PO TABS: 1.0000 | ORAL_TABLET | Freq: Every day | ORAL | 0 refills | Status: DC

## 2017-12-27 NOTE — Progress Notes (Signed)
MRN: [id number] DOB: 06-Jul-1971  Subjective:   Carrie Greer is a 47 y.o. female presenting for follow up on Hypertension. Last seen on 12/12/17 and HTN was uncontrolled. Amlodipine 2.5mg  added to regimen. Since visit, patient is checking blood pressure at home, range is 403-474 systolic. Reports she is tolerating medication well. Denies lightheadedness, dizziness, chronic headache, double vision, chest pain, shortness of breath, heart racing, palpitations, nausea, vomiting, abdominal pain, hematuria, lower leg swelling. Lifestyle: Trying to eat better, eating mostly grilled foods. Plans to start exercising more. Has lost 4lbs since last visit.  Denies any other aggravating or relieving factors, no other questions or concerns.    Carrie Greer has a current medication list which includes the following prescription(s): amlodipine, benzonatate, ipratropium, lisinopril-hydrochlorothiazide, and guaifenesin. Also has No Known Allergies.  Carrie Greer  has a past medical history of Gestational diabetes (2009) and Hypertension. Also  has a past surgical history that includes Tubal ligation and Cesarean section.   Objective:   Vitals: BP 138/81   Pulse 78   Temp 98.4 F (36.9 C) (Oral)   Resp 18   Ht 5' 4.84" (1.647 m)   Wt 172 lb 12.8 oz (78.4 kg)   LMP 11/27/2017 (Approximate)   SpO2 99%   BMI 28.89 kg/m   Physical Exam  Constitutional: She is oriented to person, place, and time. She appears well-developed and well-nourished. No distress.  HENT:  Head: Normocephalic and atraumatic.  Mouth/Throat: Uvula is midline, oropharynx is clear and moist and mucous membranes are normal.  Eyes: Pupils are equal, round, and reactive to light. Conjunctivae and EOM are normal.  Neck: Normal range of motion.  Cardiovascular: Normal rate, regular rhythm, normal heart sounds and intact distal pulses.  Pulmonary/Chest: Effort normal and breath sounds normal. She has no wheezes. She has no rhonchi. She  has no rales.  Musculoskeletal:       Right lower leg: She exhibits no swelling.       Left lower leg: She exhibits no swelling.  Neurological: She is alert and oriented to person, place, and time.  Skin: Skin is warm and dry.  Psychiatric: She has a normal mood and affect.  Vitals reviewed.   No results found for this or any previous visit (from the past 24 hour(s)).  BP Readings from Last 3 Encounters:  12/27/17 138/81  12/12/17 (!) 160/94  06/08/17 (!) 158/98   Wt Readings from Last 3 Encounters:  12/27/17 172 lb 12.8 oz (78.4 kg)  12/12/17 176 lb 3.2 oz (79.9 kg)  05/24/17 178 lb 12.8 oz (81.1 kg)   Results for orders placed or performed in visit on 12/27/17 (from the past 24 hour(s))  POCT urinalysis dipstick     Status: Abnormal   Collection Time: 12/27/17  5:37 PM  Result Value Ref Range   Color, UA yellow yellow   Clarity, UA clear clear   Glucose, UA negative negative mg/dL   Bilirubin, UA negative negative   Ketones, POC UA negative negative mg/dL   Spec Grav, UA 1.020 1.010 - 1.025   Blood, UA trace-lysed (A) negative   pH, UA 6.0 5.0 - 8.0   Protein Ur, POC negative negative mg/dL   Urobilinogen, UA 0.2 0.2 or 1.0 E.U./dL   Nitrite, UA Negative Negative   Leukocytes, UA Negative Negative  POCT Microscopic Urinalysis ([facility])     Status: Abnormal   Collection Time: 12/27/17  5:43 PM  Result Value Ref Range   WBC,UR,HPF,POC Few (A) None WBC/hpf  RBC,UR,HPF,POC None None RBC/hpf   Bacteria None None, Too numerous to count   Mucus Absent Absent   Epithelial Cells, UR Per Microscopy Few (A) None, Too numerous to count cells/hpf     Assessment and Plan :  1. Essential hypertension Controlled. Rec continuing with current medication regimen. Goal is <140/90 and >100/60. F/u in 6 months. - lisinopril-hydrochlorothiazide (PRINZIDE,ZESTORETIC) 20-25 MG tablet; Take 1 tablet by mouth daily.  Dispense: 90 tablet; Refill: 0 - amLODipine (NORVASC) 5 MG tablet; Take  0.5 tablets (2.5 mg total) by mouth daily.  Dispense: 90 tablet; Refill: 0  2. Abnormal urinalysis Trace blood noted on dipstick. No RBCs noted on urine micro, which is reassuring.   - POCT urinalysis dipstick - POCT Microscopic Urinalysis ([facility])  Tenna Delaine, PA-C  [facility] [place] 12/27/2017 5:08 PM

## 2017-12-27 NOTE — Patient Instructions (Addendum)
BP goal is <140/90 and >100/60. As long within this range, continue current medication.  If always >140/90, increase amlodipine to 10mg . If going <100/60, stop amlodipine.  Let's shoot for weight loss of 26 lbs in 6 months. Continue with healthy diet and start increasing exercise daily as tolerated.  Follow up in 6 months.    Calorie Counting for Weight Loss Calories are units of energy. Your body needs a certain amount of calories from food to keep you going throughout the day. When you eat more calories than your body needs, your body stores the extra calories as fat. When you eat fewer calories than your body needs, your body burns fat to get the energy it needs. Calorie counting means keeping track of how many calories you eat and drink each day. Calorie counting can be helpful if you need to lose weight. If you make sure to eat fewer calories than your body needs, you should lose weight. Ask your health care provider what a healthy weight is for you. For calorie counting to work, you will need to eat the right number of calories in a day in order to lose a healthy amount of weight per week. A dietitian can help you determine how many calories you need in a day and will give you suggestions on how to reach your calorie goal.  A healthy amount of weight to lose per week is usually 1-2 lb (0.5-0.9 kg). This usually means that your daily calorie intake should be reduced by 500-750 calories.  Eating 1,200 - 1,500 calories per day can help most women lose weight.  Eating 1,500 - 1,800 calories per day can help most men lose weight.  What is my plan? My goal is to have __________ calories per day. If I have this many calories per day, I should lose around __________ pounds per week. What do I need to know about calorie counting? In order to meet your daily calorie goal, you will need to:  Find out how many calories are in each food you would like to eat. Try to do this before you  eat.  Decide how much of the food you plan to eat.  Write down what you ate and how many calories it had. Doing this is called keeping a food log.  To successfully lose weight, it is important to balance calorie counting with a healthy lifestyle that includes regular activity. Aim for 150 minutes of moderate exercise (such as walking) or 75 minutes of vigorous exercise (such as running) each week. Where do I find calorie information?  The number of calories in a food can be found on a Nutrition Facts label. If a food does not have a Nutrition Facts label, try to look up the calories online or ask your dietitian for help. Remember that calories are listed per serving. If you choose to have more than one serving of a food, you will have to multiply the calories per serving by the amount of servings you plan to eat. For example, the label on a package of bread might say that a serving size is 1 slice and that there are 90 calories in a serving. If you eat 1 slice, you will have eaten 90 calories. If you eat 2 slices, you will have eaten 180 calories. How do I keep a food log? Immediately after each meal, record the following information in your food log:  What you ate. Don't forget to include toppings, sauces, and other extras on the  food.  How much you ate. This can be measured in cups, ounces, or number of items.  How many calories each food and drink had.  The total number of calories in the meal.  Keep your food log near you, such as in a small notebook in your pocket, or use a mobile app or website. Some programs will calculate calories for you and show you how many calories you have left for the day to meet your goal. What are some calorie counting tips?  Use your calories on foods and drinks that will fill you up and not leave you hungry: ? Some examples of foods that fill you up are nuts and nut butters, vegetables, lean proteins, and high-fiber foods like whole grains. High-fiber  foods are foods with more than 5 g fiber per serving. ? Drinks such as sodas, specialty coffee drinks, alcohol, and juices have a lot of calories, yet do not fill you up.  Eat nutritious foods and avoid empty calories. Empty calories are calories you get from foods or beverages that do not have many vitamins or protein, such as candy, sweets, and soda. It is better to have a nutritious high-calorie food (such as an avocado) than a food with few nutrients (such as a bag of chips).  Know how many calories are in the foods you eat most often. This will help you calculate calorie counts faster.  Pay attention to calories in drinks. Low-calorie drinks include water and unsweetened drinks.  Pay attention to nutrition labels for "low fat" or "fat free" foods. These foods sometimes have the same amount of calories or more calories than the full fat versions. They also often have added sugar, starch, or salt, to make up for flavor that was removed with the fat.  Find a way of tracking calories that works for you. Get creative. Try different apps or programs if writing down calories does not work for you. What are some portion control tips?  Know how many calories are in a serving. This will help you know how many servings of a certain food you can have.  Use a measuring cup to measure serving sizes. You could also try weighing out portions on a kitchen scale. With time, you will be able to estimate serving sizes for some foods.  Take some time to put servings of different foods on your favorite plates, bowls, and cups so you know what a serving looks like.  Try not to eat straight from a bag or box. Doing this can lead to overeating. Put the amount you would like to eat in a cup or on a plate to make sure you are eating the right portion.  Use smaller plates, glasses, and bowls to prevent overeating.  Try not to multitask (for example, watch TV or use your computer) while eating. If it is time to eat,  sit down at a table and enjoy your food. This will help you to know when you are full. It will also help you to be aware of what you are eating and how much you are eating. What are tips for following this plan? Reading food labels  Check the calorie count compared to the serving size. The serving size may be smaller than what you are used to eating.  Check the source of the calories. Make sure the food you are eating is high in vitamins and protein and low in saturated and trans fats. Shopping  Read nutrition labels while you shop. This  will help you make healthy decisions before you decide to purchase your food.  Make a grocery list and stick to it. Cooking  Try to cook your favorite foods in a healthier way. For example, try baking instead of frying.  Use low-fat dairy products. Meal planning  Use more fruits and vegetables. Half of your plate should be fruits and vegetables.  Include lean proteins like poultry and fish. How do I count calories when eating out?  Ask for smaller portion sizes.  Consider sharing an entree and sides instead of getting your own entree.  If you get your own entree, eat only half. Ask for a box at the beginning of your meal and put the rest of your entree in it so you are not tempted to eat it.  If calories are listed on the menu, choose the lower calorie options.  Choose dishes that include vegetables, fruits, whole grains, low-fat dairy products, and lean protein.  Choose items that are boiled, broiled, grilled, or steamed. Stay away from items that are buttered, battered, fried, or served with cream sauce. Items labeled "crispy" are usually fried, unless stated otherwise.  Choose water, low-fat milk, unsweetened iced tea, or other drinks without added sugar. If you want an alcoholic beverage, choose a lower calorie option such as a glass of wine or light beer.  Ask for dressings, sauces, and syrups on the side. These are usually high in  calories, so you should limit the amount you eat.  If you want a salad, choose a garden salad and ask for grilled meats. Avoid extra toppings like bacon, cheese, or fried items. Ask for the dressing on the side, or ask for olive oil and vinegar or lemon to use as dressing.  Estimate how many servings of a food you are given. For example, a serving of cooked rice is  cup or about the size of half a baseball. Knowing serving sizes will help you be aware of how much food you are eating at restaurants. The list below tells you how big or small some common portion sizes are based on everyday objects: ? 1 oz-4 stacked dice. ? 3 oz-1 deck of cards. ? 1 tsp-1 die. ? 1 Tbsp- a ping-pong ball. ? 2 Tbsp-1 ping-pong ball. ?  cup- baseball. ? 1 cup-1 baseball. Summary  Calorie counting means keeping track of how many calories you eat and drink each day. If you eat fewer calories than your body needs, you should lose weight.  A healthy amount of weight to lose per week is usually 1-2 lb (0.5-0.9 kg). This usually means reducing your daily calorie intake by 500-750 calories.  The number of calories in a food can be found on a Nutrition Facts label. If a food does not have a Nutrition Facts label, try to look up the calories online or ask your dietitian for help.  Use your calories on foods and drinks that will fill you up, and not on foods and drinks that will leave you hungry.  Use smaller plates, glasses, and bowls to prevent overeating. This information is not intended to replace advice given to you by your health care provider. Make sure you discuss any questions you have with your health care provider. Document Released: 07/17/2005 Document Revised: 06/16/2016 Document Reviewed: 06/16/2016 Elsevier Interactive Patient Education  2018 [employer].   Exercising to [employer] Exercising can help you to lose weight. In order to lose weight through exercise, you need to do vigorous-intensity  exercise.  You can tell that you are exercising with vigorous intensity if you are breathing very hard and fast and cannot hold a conversation while exercising. Moderate-intensity exercise helps to maintain your current weight. You can tell that you are exercising at a moderate level if you have a higher heart rate and faster breathing, but you are still able to hold a conversation. How often should I exercise? Choose an activity that you enjoy and set realistic goals. Your health care provider can help you to make an activity plan that works for you. Exercise regularly as directed by your health care provider. This may include:  Doing resistance training twice each week, such as: ? Push-ups. ? Sit-ups. ? Lifting weights. ? Using resistance bands.  Doing a given intensity of exercise for a given amount of time. Choose from these options: ? 150 minutes of moderate-intensity exercise every week. ? 75 minutes of vigorous-intensity exercise every week. ? A mix of moderate-intensity and vigorous-intensity exercise every week.  Children, pregnant women, people who are out of shape, people who are overweight, and older adults may need to consult a health care provider for individual recommendations. If you have any sort of medical condition, be sure to consult your health care provider before starting a new exercise program. What are some activities that can help me to lose weight?  Walking at a rate of at least 4.5 miles an hour.  Jogging or running at a rate of 5 miles per hour.  Biking at a rate of at least 10 miles per hour.  Lap swimming.  Roller-skating or in-line skating.  Cross-country skiing.  Vigorous competitive sports, such as football, basketball, and soccer.  Jumping rope.  Aerobic dancing. How can I be more active in my day-to-day activities?  Use the stairs instead of the elevator.  Take a walk during your lunch break.  If you drive, park your car farther away from  work or school.  If you take public transportation, get off one stop early and walk the rest of the way.  Make all of your phone calls while standing up and walking around.  Get up, stretch, and walk around every 30 minutes throughout the day. What guidelines should I follow while exercising?  Do not exercise so much that you hurt yourself, feel dizzy, or get very short of breath.  Consult your health care provider prior to starting a new exercise program.  Wear comfortable clothes and shoes with good support.  Drink plenty of water while you exercise to prevent dehydration or heat stroke. Body water is lost during exercise and must be replaced.  Work out until you breathe faster and your heart beats faster. This information is not intended to replace advice given to you by your health care provider. Make sure you discuss any questions you have with your health care provider. Document Released: 08/19/2010 Document Revised: 12/23/2015 Document Reviewed: 12/18/2013 Elsevier Interactive Patient Education  2018 [employer].   IF you received an x-ray today, you will receive an invoice from [facility]. Please contact [facility] at [phone] with questions or concerns regarding your invoice.   IF you received labwork today, you will receive an invoice from [facility]. Please contact [facility] at [phone] with questions or concerns regarding your invoice.   Our billing staff will not be able to assist you with questions regarding bills from these companies.  You will be contacted with the lab results as soon as they are available. The fastest way to get  your results is to activate your My Chart account. Instructions are located on the last page of this paperwork. If you have not heard from [place] regarding the results in 2 weeks, please contact this office.

## 2018-05-13 ENCOUNTER — Encounter: Payer: Self-pay | Admitting: Physician Assistant

## 2018-05-13 ENCOUNTER — Other Ambulatory Visit: Payer: Self-pay

## 2018-05-13 ENCOUNTER — Ambulatory Visit: Payer: Managed Care, Other (non HMO) | Admitting: Physician Assistant

## 2018-05-13 VITALS — BP 150/90 | HR 99 | Temp 98.0°F | Resp 18 | Ht 65.16 in | Wt 177.6 lb

## 2018-05-13 DIAGNOSIS — I10 Essential (primary) hypertension: Secondary | ICD-10-CM | POA: Diagnosis not present

## 2018-05-13 DIAGNOSIS — M62838 Other muscle spasm: Secondary | ICD-10-CM

## 2018-05-13 MED ORDER — AMLODIPINE BESYLATE 5 MG PO TABS: 2.5000 mg | ORAL_TABLET | Freq: Every day | ORAL | 0 refills | Status: DC

## 2018-05-13 MED ORDER — LISINOPRIL-HYDROCHLOROTHIAZIDE 20-25 MG PO TABS: 1.0000 | ORAL_TABLET | Freq: Every day | ORAL | 1 refills | Status: DC

## 2018-05-13 MED ORDER — CYCLOBENZAPRINE HCL 5 MG PO TABS: 5.0000 mg | ORAL_TABLET | Freq: Three times a day (TID) | ORAL | 0 refills | Status: DC | PRN

## 2018-05-13 NOTE — Patient Instructions (Addendum)
For shoulder pain, I suggest a muscle relaxant (the one I prescribed is flexeril and it can make you dizzy) as needed along with over the counter ibuprofen. Use a heating pad to affected area for 20 minutes at a time. I also suggest doing massage therapy. If no improvement or your symptoms worsen, please seek care.   For blood pressure, continue medication as you are. . It is best if you check the blood pressure at different times in the day. Your goal is <140/90. If your values are consistently above this goal, please return to office for further evaluation. If you start to have chest pain, blurred vision, shortness of breath, severe headache, lower leg swelling, or nausea/vomiting please seek care immediately here or at the ED.     If you have lab work done today you will be contacted with your lab results within the next 2 weeks.  If you have not heard from [place] then please contact us. The fastest way to get your results is to register for My Chart.  Shoulder Exercises Ask your health care provider which exercises are safe for you. Do exercises exactly as told by your health care provider and adjust them as directed. It is normal to feel mild stretching, pulling, tightness, or discomfort as you do these exercises, but you should stop right away if you feel sudden pain or your pain gets worse.Do not begin these exercises until told by your health care provider. RANGE OF MOTION EXERCISES These exercises warm up your muscles and joints and improve the movement and flexibility of your shoulder. These exercises also help to relieve pain, numbness, and tingling. These exercises involve stretching your injured shoulder directly. Exercise A: Pendulum  1. Stand near a wall or a surface that you can hold onto for balance. 2. Bend at the waist and let your left / right arm hang straight down. Use your other arm to support you. Keep your back straight and do not lock your knees. 3. Relax your left / right  arm and shoulder muscles, and move your hips and your trunk so your left / right arm swings freely. Your arm should swing because of the motion of your body, not because you are using your arm or shoulder muscles. 4. Keep moving your body so your arm swings in the following directions, as told by your health care provider: ? Side to side. ? Forward and backward. ? In clockwise and counterclockwise circles. 5. Continue each motion for __________ seconds, or for as long as told by your health care provider. 6. Slowly return to the starting position. Repeat __________ times. Complete this exercise __________ times a day. Exercise B:Flexion, Standing  1. Stand and hold a broomstick, a cane, or a similar object. Place your hands a little more than shoulder-width apart on the object. Your left / right hand should be palm-up, and your other hand should be palm-down. 2. Keep your elbow straight and keep your shoulder muscles relaxed. Push the stick down with your healthy arm to raise your left / right arm in front of your body, and then over your head until you feel a stretch in your shoulder. ? Avoid shrugging your shoulder while you raise your arm. Keep your shoulder blade tucked down toward the middle of your back. 3. Hold for __________ seconds. 4. Slowly return to the starting position. Repeat __________ times. Complete this exercise __________ times a day. Exercise C: Abduction, Standing 1. Stand and hold a broomstick, a cane,  or a similar object. Place your hands a little more than shoulder-width apart on the object. Your left / right hand should be palm-up, and your other hand should be palm-down. 2. While keeping your elbow straight and your shoulder muscles relaxed, push the stick across your body toward your left / right side. Raise your left / right arm to the side of your body and then over your head until you feel a stretch in your shoulder. ? Do not raise your arm above shoulder height,  unless your health care provider tells you to do that. ? Avoid shrugging your shoulder while you raise your arm. Keep your shoulder blade tucked down toward the middle of your back. 3. Hold for __________ seconds. 4. Slowly return to the starting position. Repeat __________ times. Complete this exercise __________ times a day. Exercise D:Internal Rotation  1. Place your left / right hand behind your back, palm-up. 2. Use your other hand to dangle an exercise band, a towel, or a similar object over your shoulder. Grasp the band with your left / right hand so you are holding onto both ends. 3. Gently pull up on the band until you feel a stretch in the front of your left / right shoulder. ? Avoid shrugging your shoulder while you raise your arm. Keep your shoulder blade tucked down toward the middle of your back. 4. Hold for __________ seconds. 5. Release the stretch by letting go of the band and lowering your hands. Repeat __________ times. Complete this exercise __________ times a day. STRETCHING EXERCISES These exercises warm up your muscles and joints and improve the movement and flexibility of your shoulder. These exercises also help to relieve pain, numbness, and tingling. These exercises are done using your healthy shoulder to help stretch the muscles of your injured shoulder. Exercise E: Warehouse manager (External Rotation and Abduction)  1. Stand in a doorway with one of your feet slightly in front of the other. This is called a staggered stance. If you cannot reach your forearms to the door frame, stand facing a corner of a room. 2. Choose one of the following positions as told by your health care provider: ? Place your hands and forearms on the door frame above your head. ? Place your hands and forearms on the door frame at the height of your head. ? Place your hands on the door frame at the height of your elbows. 3. Slowly move your weight onto your front foot until you feel a stretch  across your chest and in the front of your shoulders. Keep your head and chest upright and keep your abdominal muscles tight. 4. Hold for __________ seconds. 5. To release the stretch, shift your weight to your back foot. Repeat __________ times. Complete this stretch __________ times a day. Exercise F:Extension, Standing 1. Stand and hold a broomstick, a cane, or a similar object behind your back. ? Your hands should be a little wider than shoulder-width apart. ? Your palms should face away from your back. 2. Keeping your elbows straight and keeping your shoulder muscles relaxed, move the stick away from your body until you feel a stretch in your shoulder. ? Avoid shrugging your shoulders while you move the stick. Keep your shoulder blade tucked down toward the middle of your back. 3. Hold for __________ seconds. 4. Slowly return to the starting position. Repeat __________ times. Complete this exercise __________ times a day. STRENGTHENING EXERCISES These exercises build strength and endurance in your shoulder. Endurance  is the ability to use your muscles for a long time, even after they get tired. Exercise G:External Rotation  1. Sit in a stable chair without armrests. 2. Secure an exercise band at elbow height on your left / right side. 3. Place a soft object, such as a folded towel or a small pillow, between your left / right upper arm and your body to move your elbow a few inches away (about 10 cm) from your side. 4. Hold the end of the band so it is tight and there is no slack. 5. Keeping your elbow pressed against the soft object, move your left / right forearm out, away from your abdomen. Keep your body steady so only your forearm moves. 6. Hold for __________ seconds. 7. Slowly return to the starting position. Repeat __________ times. Complete this exercise __________ times a day. Exercise H:Shoulder Abduction  1. Sit in a stable chair without armrests, or stand. 2. Hold a  __________ weight in your left / right hand, or hold an exercise band with both hands. 3. Start with your arms straight down and your left / right palm facing in, toward your body. 4. Slowly lift your left / right hand out to your side. Do not lift your hand above shoulder height unless your health care provider tells you that this is safe. ? Keep your arms straight. ? Avoid shrugging your shoulder while you do this movement. Keep your shoulder blade tucked down toward the middle of your back. 5. Hold for __________ seconds. 6. Slowly lower your arm, and return to the starting position. Repeat __________ times. Complete this exercise __________ times a day. Exercise I:Shoulder Extension 1. Sit in a stable chair without armrests, or stand. 2. Secure an exercise band to a stable object in front of you where it is at shoulder height. 3. Hold one end of the exercise band in each hand. Your palms should face each other. 4. Straighten your elbows and lift your hands up to shoulder height. 5. Step back, away from the secured end of the exercise band, until the band is tight and there is no slack. 6. Squeeze your shoulder blades together as you pull your hands down to the sides of your thighs. Stop when your hands are straight down by your sides. Do not let your hands go behind your body. 7. Hold for __________ seconds. 8. Slowly return to the starting position. Repeat __________ times. Complete this exercise __________ times a day. Exercise J:Standing Shoulder Row 1. Sit in a stable chair without armrests, or stand. 2. Secure an exercise band to a stable object in front of you so it is at waist height. 3. Hold one end of the exercise band in each hand. Your palms should be in a thumbs-up position. 4. Bend each of your elbows to an "L" shape (about 90 degrees) and keep your upper arms at your sides. 5. Step back until the band is tight and there is no slack. 6. Slowly pull your elbows back behind  you. 7. Hold for __________ seconds. 8. Slowly return to the starting position. Repeat __________ times. Complete this exercise __________ times a day. Exercise K:Shoulder Press-Ups  1. Sit in a stable chair that has armrests. Sit upright, with your feet flat on the floor. 2. Put your hands on the armrests so your elbows are bent and your fingers are pointing forward. Your hands should be about even with the sides of your body. 3. Push down on the armrests  and use your arms to lift yourself off of the chair. Straighten your elbows and lift yourself up as much as you comfortably can. ? Move your shoulder blades down, and avoid letting your shoulders move up toward your ears. ? Keep your feet on the ground. As you get stronger, your feet should support less of your body weight as you lift yourself up. 4. Hold for __________ seconds. 5. Slowly lower yourself back into the chair. Repeat __________ times. Complete this exercise __________ times a day. Exercise L: Wall Push-Ups  1. Stand so you are facing a stable wall. Your feet should be about one arm-length away from the wall. 2. Lean forward and place your palms on the wall at shoulder height. 3. Keep your feet flat on the floor as you bend your elbows and lean forward toward the wall. 4. Hold for __________ seconds. 5. Straighten your elbows to push yourself back to the starting position. Repeat __________ times. Complete this exercise __________ times a day. This information is not intended to replace advice given to you by your health care provider. Make sure you discuss any questions you have with your health care provider. Document Released: 05/31/2005 Document Revised: 04/10/2016 Document Reviewed: 03/28/2015 Elsevier Interactive Patient Education  2018 [employer].   IF you received an x-ray today, you will receive an invoice from [facility]. Please contact [facility] at [phone] with questions or concerns  regarding your invoice.   IF you received labwork today, you will receive an invoice from [facility]. Please contact [facility] at [phone] with questions or concerns regarding your invoice.   Our billing staff will not be able to assist you with questions regarding bills from these companies.  You will be contacted with the lab results as soon as they are available. The fastest way to get your results is to activate your My Chart account. Instructions are located on the last page of this paperwork. If you have not heard from [place] regarding the results in 2 weeks, please contact this office.

## 2018-05-13 NOTE — Progress Notes (Signed)
MRN: [id number] DOB: April 18, 1971  Subjective:   Carrie Greer is a 47 y.o. female presenting for follow up on Hypertension.   Currently managed with norvasc 2,5mg  and lisinopril-hctz. Patient is checking blood pressure at home, range is 812-751 systolic, most often in 700F and 130s. Denies lightheadedness, dizziness, chronic headache, double vision, chest pain, shortness of breath, heart racing, palpitations, nausea, vomiting, abdominal pain, hematuria, lower leg swelling.   Also having left shoulder pain. The symptoms began 2 weeks ago. 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 Pain is located between the neck and shoulder. Discomfort is described as sharp/stabbing. Symptoms are exacerbated by repetitive movements, lying on the shoulder and lifting. Evaluation to date: none. Therapy to date includes: CBD oil, which has helped.   Carrie Greer has a current medication list which includes the following prescription(s): amlodipine, ipratropium, lisinopril-hydrochlorothiazide, and benzonatate. Also has No Known Allergies.  Carrie Greer  has a past medical history of Gestational diabetes (2009) and Hypertension. Also  has a past surgical history that includes Tubal ligation and Cesarean section.   Objective:   Vitals: BP (!) 150/90   Pulse 99   Temp 98 F (36.7 C) (Oral)   Resp 18   Ht 5' 5.16" (1.655 m)   Wt 177 lb 9.6 oz (80.6 kg)   LMP 04/30/2018 (Exact Date)   SpO2 100%   BMI 29.41 kg/m   Physical Exam  Constitutional: She is oriented to person, place, and time. She appears well-developed and well-nourished. No distress.  HENT:  Head: Normocephalic and atraumatic.  Mouth/Throat: Uvula is midline, oropharynx is clear and moist and mucous membranes are normal.  Eyes: Pupils are equal, round, and reactive to light. Conjunctivae and EOM are normal.  Neck: Normal range of motion.  Cardiovascular: Normal rate, regular  rhythm, normal heart sounds and intact distal pulses.  Pulmonary/Chest: Effort normal and breath sounds normal. She has no wheezes. She has no rhonchi. She has no rales.  Musculoskeletal:       Right shoulder: Normal.       Left shoulder: She exhibits tenderness and spasm. She exhibits normal range of motion, no bony tenderness, no swelling, normal pulse and normal strength.       Cervical back: She exhibits normal range of motion and no bony tenderness.       Back:       Right lower leg: She exhibits no swelling.       Left lower leg: She exhibits no swelling.  Neurological: She is alert and oriented to person, place, and time.  Reflex Scores:      Tricep reflexes are 2+ on the right side and 2+ on the left side.      Bicep reflexes are 2+ on the right side and 2+ on the left side.      Brachioradialis reflexes are 2+ on the right side and 2+ on the left side. Sensation of BUE intact.   Skin: Skin is warm and dry.  Psychiatric: She has a normal mood and affect.  Vitals reviewed.   No results found for this or any previous visit (from the past 24 hour(s)).  BP Readings from Last 3 Encounters:  05/13/18 (!) 150/90  12/27/17 138/81  12/12/17 (!) 160/94    Assessment and Plan :  1. Essential hypertension Slightly uncontrolled in office but home BP recordings are at range.  Compared home bp monitor to our in office readings and values  were consistent.  Likely has whitecoat syndrome.  Suggest continuing current medication regimen.  BMP pending.  Follow-up in 6 months for CPE.  Will obtain further lab work at that visit. - Basic metabolic panel - lisinopril-hydrochlorothiazide (PRINZIDE,ZESTORETIC) 20-25 MG tablet; Take 1 tablet by mouth daily.  Dispense: 90 tablet; Refill: 1 - amLODipine (NORVASC) 5 MG tablet; Take 0.5 tablets (2.5 mg total) by mouth daily.  Dispense: 90 tablet; Refill: 0  2. Muscle spasm History and physical exam findings consistent with muscle spasm.  No bony  tenderness noted on exam.  NVI.  Suggest rest, heating pad, massage therapy, stretching, and over-the-counter anti-inflammatories as prescribed and prescription muscle relaxeant as needed.  Advised to return to clinic if symptoms worsen, do not improve, or as needed.  - cyclobenzaprine (FLEXERIL) 5 MG tablet; Take 1 tablet (5 mg total) by mouth 3 (three) times daily as needed for muscle spasms.  Dispense: 60 tablet; Refill: 0   Tenna Delaine, PA-C  [facility] [place] 05/13/2018 3:55 PM

## 2018-05-14 ENCOUNTER — Other Ambulatory Visit: Payer: Self-pay | Admitting: Physician Assistant

## 2018-05-14 DIAGNOSIS — E876 Hypokalemia: Secondary | ICD-10-CM

## 2018-05-14 LAB — BASIC METABOLIC PANEL
BUN / CREAT RATIO: 15 (ref 9–23)
BUN: 15 mg/dL (ref 6–24)
CHLORIDE: 98 mmol/L (ref 96–106)
CO2: 27 mmol/L (ref 20–29)
Calcium: 9.7 mg/dL (ref 8.7–10.2)
Creatinine, Ser: 0.97 mg/dL (ref 0.57–1.00)
GFR calc non Af Amer: 69 mL/min/{1.73_m2} (ref >59)
GFR, EST AFRICAN AMERICAN: 80 mL/min/{1.73_m2} (ref >59)
Glucose: 100 mg/dL — ABNORMAL HIGH (ref 65–99)
POTASSIUM: 3.4 mmol/L — AB (ref 3.5–5.2)
SODIUM: 141 mmol/L (ref 134–144)

## 2018-05-14 NOTE — Progress Notes (Signed)
Orders Placed This Encounter  Procedures  . Basic metabolic panel    Standing Status:   Future    Standing Expiration Date:   05/28/2018

## 2018-12-09 ENCOUNTER — Other Ambulatory Visit: Payer: Self-pay | Admitting: Physician Assistant

## 2018-12-09 DIAGNOSIS — I10 Essential (primary) hypertension: Secondary | ICD-10-CM

## 2019-01-30 ENCOUNTER — Encounter: Payer: Managed Care, Other (non HMO) | Admitting: Family Medicine

## 2019-02-12 ENCOUNTER — Other Ambulatory Visit: Payer: Self-pay

## 2019-02-12 ENCOUNTER — Ambulatory Visit (INDEPENDENT_AMBULATORY_CARE_PROVIDER_SITE_OTHER): Payer: Managed Care, Other (non HMO) | Admitting: Family Medicine

## 2019-02-12 ENCOUNTER — Encounter: Payer: Self-pay | Admitting: Family Medicine

## 2019-02-12 ENCOUNTER — Other Ambulatory Visit (HOSPITAL_COMMUNITY)
Admission: RE | Admit: 2019-02-12 | Discharge: 2019-02-12 | Disposition: A | Discharge status: Home / Self Care | Payer: Managed Care, Other (non HMO) | Source: Ambulatory Visit | Attending: Family Medicine | Admitting: Family Medicine

## 2019-02-12 VITALS — BP 138/86 | HR 119 | Temp 98.3°F | Resp 14 | Wt 177.6 lb

## 2019-02-12 DIAGNOSIS — I10 Essential (primary) hypertension: Secondary | ICD-10-CM

## 2019-02-12 DIAGNOSIS — Z124 Encounter for screening for malignant neoplasm of cervix: Secondary | ICD-10-CM

## 2019-02-12 DIAGNOSIS — Z23 Encounter for immunization: Secondary | ICD-10-CM

## 2019-02-12 DIAGNOSIS — Z113 Encounter for screening for infections with a predominantly sexual mode of transmission: Secondary | ICD-10-CM | POA: Diagnosis not present

## 2019-02-12 DIAGNOSIS — R7301 Impaired fasting glucose: Secondary | ICD-10-CM | POA: Diagnosis not present

## 2019-02-12 DIAGNOSIS — R000 Tachycardia, unspecified: Secondary | ICD-10-CM

## 2019-02-12 DIAGNOSIS — Z0001 Encounter for general adult medical examination with abnormal findings: Secondary | ICD-10-CM

## 2019-02-12 DIAGNOSIS — N951 Menopausal and female climacteric states: Secondary | ICD-10-CM

## 2019-02-12 DIAGNOSIS — Z1329 Encounter for screening for other suspected endocrine disorder: Secondary | ICD-10-CM

## 2019-02-12 DIAGNOSIS — Z13220 Encounter for screening for lipoid disorders: Secondary | ICD-10-CM | POA: Diagnosis not present

## 2019-02-12 DIAGNOSIS — Z0000 Encounter for general adult medical examination without abnormal findings: Secondary | ICD-10-CM

## 2019-02-12 DIAGNOSIS — Z1211 Encounter for screening for malignant neoplasm of colon: Secondary | ICD-10-CM

## 2019-02-12 MED ORDER — LISINOPRIL-HYDROCHLOROTHIAZIDE 20-25 MG PO TABS: 1.0000 | ORAL_TABLET | Freq: Every day | ORAL | 2 refills | Status: DC

## 2019-02-12 MED ORDER — AMLODIPINE BESYLATE 5 MG PO TABS: 2.5000 mg | ORAL_TABLET | Freq: Every day | ORAL | 2 refills | Status: DC

## 2019-02-12 NOTE — Progress Notes (Signed)
Subjective:    Patient ID: Carrie Greer, female    DOB: 24-Apr-1971, 48 y.o.   MRN: [id number]  HPI Carrie Greer is a 48 y.o. female Presents today for: Chief Complaint  Patient presents with   Annual Exam    patient is here for her annual physical. Patient has no issus at this time   Hypertension: BP Readings from Last 3 Encounters:  02/12/19 138/86  05/13/18 (!) 150/90  12/27/17 138/81   Lab Results  Component Value Date   CREATININE 0.97 05/13/2018  norvasc 2.5 mg and lisinopril hct 20/25mg  QD.  No new side effects.  Home readings: 128/79. 130/80, 118/79.   FH: mom with brain tumor - unknown type, removed without difficulty. DM HTN in mom. HTN in dad.   Thinks may be going through the change. Irregular menses past 6-8 months, episodic hot flushes. Mom with change at 38yo. Hx of BTL. Some hot flushes - not taking any meds.  Some weight fluctuations - up and down.  Some cold nature at time, no skin or hair changes.  Wt Readings from Last 3 Encounters:  02/12/19 177 lb 9.6 oz (80.6 kg)  05/13/18 177 lb 9.6 oz (80.6 kg)  12/27/17 172 lb 12.8 oz (78.4 kg)    Occasional joint pains, muscle aches with age - no limitations.   Cancer screening: Breast: last mammogram at age 89, 48yo, then advised to follow up at age 72.  No FH of colon CA.  Pap in July 2017. Plan for pap today.   STI screening - denies prior infection or new partners, but requests testing.   Immunization History  Administered Date(s) Administered   PPD Test 08/05/2015, 08/10/2015, 08/30/2017, 09/03/2017   Tdap 12/30/2007  out of Td at this time - will have done at next visit.    Depression screen [facility] 2/9 02/12/2019 05/13/2018 12/27/2017 12/12/2017 05/24/2017  Decreased Interest 0 0 0 0 0  Down, Depressed, Hopeless 0 0 0 0 0  PHQ - 2 Score 0 0 0 0 0    Hearing Screening   125Hz  250Hz  500Hz  1000Hz  2000Hz  3000Hz  4000Hz  6000Hz  8000Hz   Right ear:           Left ear:             Visual Acuity Screening   Right eye Left eye Both eyes  Without correction: 20/20 20/20 20/15   With correction:     no optho - will be going to [facility] for screening   Dentist - every year.   Exercise: walking, 30-40 min 2 times per week independent exercise       Patient Active Problem List   Diagnosis Date Noted   Essential hypertension 06/24/2014   Past Medical History:  Diagnosis Date   Gestational diabetes 2009   Hypertension    Past Surgical History:  Procedure Laterality Date   CESAREAN SECTION     TUBAL LIGATION     No Known Allergies Prior to Admission medications   Medication Sig Start Date End Date Taking? Authorizing Provider  amLODipine (NORVASC) 5 MG tablet Take 0.5 tablets (2.5 mg total) by mouth daily. 05/13/18  Yes Tenna Delaine D, PA-C  lisinopril-hydrochlorothiazide (ZESTORETIC) 20-25 MG tablet Take 1 tablet by mouth once daily 12/10/18  Yes Forrest Moron, MD   Social History   Socioeconomic History   Marital status: Married    Spouse name: Carrie Greer   Number of children: 7   Years of education: college   Highest  education level: Not on file  Occupational History   Occupation: Childcare    Comment: [place] resource strain: Not on file   Food insecurity    Worry: Not on file    Inability: Not on file   Transportation needs    Medical: Not on file    Non-medical: Not on file  Tobacco Use   Smoking status: Never Smoker   Smokeless tobacco: Never Used  Substance and Sexual Activity   Alcohol use: Yes    Alcohol/week: 0.0 - 3.0 standard drinks    Comment: wine on the weekends   Drug use: No   Sexual activity: Yes    Partners: Male    Birth control/protection: Surgical    Comment: BTL 2009 after the birth of triplets, sexually active with monogamous partner  Lifestyle   Physical activity    Days per week: Not on file    Minutes per session: Not on file   Stress:  Not on file  Relationships   Social connections    Talks on phone: Not on file    Gets together: Not on file    Attends religious service: Not on file    Active member of club or organization: Not on file    Attends meetings of clubs or organizations: Not on file    Relationship status: Not on file   Intimate partner violence    Fear of current or ex partner: Not on file    Emotionally abused: Not on file    Physically abused: Not on file    Forced sexual activity: Not on file  Other Topics Concern   Not on file  Social History Narrative   Pt is from [place]. Has been in [place] since 2005.   Just graduated in 11/2016 at [employer] with degree in Early Childhood Development.Continuing on now for BA at [employer].   Lives with her husband and their 4 youngest children (triplets and a 45 year old). Has 3 other grown children. (7 children total).       Exercise includes walking, stair climbing, playing with the children.       Diet includes baked chicken, fish, potatoes, salads and greens. Likes water and minute maid OJ. Gets enough dairy through milk, cheese, and yogurt.        Review of Systems 13 point review of systems per patient health survey noted.  Negative other than as indicated above or in HPI.      Objective:   Physical Exam Vitals signs reviewed. Exam conducted with a chaperone present.  Constitutional:      Appearance: She is well-developed.  HENT:     Head: Normocephalic and atraumatic.     Right Ear: External ear normal.     Left Ear: External ear normal.  Eyes:     Conjunctiva/sclera: Conjunctivae normal.     Pupils: Pupils are equal, round, and reactive to light.  Neck:     Musculoskeletal: Normal range of motion and neck supple.     Thyroid: No thyromegaly.  Cardiovascular:     Rate and Rhythm: Normal rate and regular rhythm.     Heart sounds: Normal heart sounds. No murmur.     Comments: Normal rate on exam - states HR goes up in office at times.    Pulmonary:     Effort: Pulmonary effort is normal. No respiratory distress.     Breath sounds: Normal breath sounds. No wheezing.  Abdominal:  General: Bowel sounds are normal.     Palpations: Abdomen is soft.     Tenderness: There is no abdominal tenderness.  Genitourinary:    General: Normal vulva.     Exam position: Lithotomy position.     Labia:        Right: No rash or lesion.        Left: No rash or lesion.      Vagina: Normal.     Cervix: Normal.     Uterus: Normal.      Adnexa: Right adnexa normal and left adnexa normal.  Musculoskeletal: Normal range of motion.        General: No tenderness.  Lymphadenopathy:     Cervical: No cervical adenopathy.  Skin:    General: Skin is warm and dry.     Findings: No rash.  Neurological:     Mental Status: She is alert and oriented to person, place, and time.  Psychiatric:        Behavior: Behavior normal.        Thought Content: Thought content normal.    Vitals:   02/12/19 1139  BP: 138/86  Pulse: (!) 119  Resp: 14  Temp: 98.3 F (36.8 C)  TempSrc: Oral  SpO2: 100%  Weight: 177 lb 9.6 oz (80.6 kg)       Assessment & Plan:   Elvie Palomo is a 48 y.o. female Annual physical exam  - -anticipatory guidance as below in AVS, screening labs above. Health maintenance items as above in HPI discussed/recommended as applicable.   Essential hypertension - Plan: Comprehensive metabolic panel, lisinopril-hydrochlorothiazide (ZESTORETIC) 20-25 MG tablet, amLODipine (NORVASC) 5 MG tablet  -  Stable, tolerating current regimen. Medications refilled. Labs pending as above.   Elevated fasting blood sugar - Plan: Hemoglobin A1c  -Check A1c for diabetic screen  Screening, lipid - Plan: Lipid panel  Tachycardia - Plan: TSH Hot flushes, perimenopausal - Plan: TSH, CBC Screening for thyroid disorder - Plan: TSH  Initial tachycardia on vitals, improved on exam.  Suspect hot flashes or perimenopausal, and handout from  up-to-date given on treatment options.  Check TSH, CBC, follow-up to discuss hot flashes and prescription treatment further if needed.  Special screening for malignant neoplasms, colon - Plan: Ambulatory referral to Gastroenterology  Routine screening for STI (sexually transmitted infection) - Plan: RPR, HIV Antibody (routine testing w rflx), Cytology - PAP([place]) Cervical cancer screening - Plan: Cytology - PAP([place])  Need for Td vaccine - Plan: Td vaccine greater than or equal to 7yo preservative free IM Out of TD vaccine at this time, will plan to update at future visit.  Meds ordered this encounter  Medications   lisinopril-hydrochlorothiazide (ZESTORETIC) 20-25 MG tablet    Sig: Take 1 tablet by mouth daily.    Dispense:  90 tablet    Refill:  2   amLODipine (NORVASC) 5 MG tablet    Sig: Take 0.5 tablets (2.5 mg total) by mouth daily.    Dispense:  45 tablet    Refill:  2   Patient Instructions   Follow up to discuss prescription treatment for hot flushes if needed.  I will refer you for colonoscopy, but schedule mammogram.  Tetanus can be updated next visit.  If any concerns on labs, will let you know. No change in meds for now. Recheck in 6 months.       If you have lab work done today you will be contacted with your lab results within  the next 2 weeks.  If you have not heard from [place] then please contact us. The fastest way to get your results is to register for My Chart.   IF you received an x-ray today, you will receive an invoice from [facility]. Please contact [facility] at [phone] with questions or concerns regarding your invoice.   IF you received labwork today, you will receive an invoice from [place]. Please contact [facility] at [phone] with questions or concerns regarding your invoice.   Our billing staff will not be able to assist you with questions regarding bills from these companies.  You will be contacted with  the lab results as soon as they are available. The fastest way to get your results is to activate your My Chart account. Instructions are located on the last page of this paperwork. If you have not heard from [place] regarding the results in 2 weeks, please contact this office.     Keeping You Healthy  Get These Tests 1. Blood Pressure- Have your blood pressure checked once a year by your health care provider.  Normal blood pressure is 120/80. 2. Weight- Have your body mass index (BMI) calculated to screen for obesity.  BMI is measure of body fat based on height and weight.  You can also calculate your own BMI at GravelBags.it. 3. Cholesterol- Have your cholesterol checked every 5 years starting at age 23 then yearly starting at age 76. 70. Chlamydia, HIV, and other sexually transmitted diseases- Get screened every year until age 105, then within three months of each new sexual provider. 5. Pap Test - Every 1-5 years; discuss with your health care provider. 6. Mammogram- Every 1-2 years starting at age 63--50  Take these medicines  Calcium with Vitamin D-Your body needs 1200 mg of Calcium each day and [phone] IU of Vitamin D daily.  Your body can only absorb 500 mg of Calcium at a time so Calcium must be taken in 2 or 3 divided doses throughout the day.  Multivitamin with folic acid- Once daily if it is possible for you to become pregnant.  Get these Immunizations  Gardasil-Series of three doses; prevents HPV related illness such as genital warts and cervical cancer.  Menactra-Single dose; prevents meningitis.  Tetanus shot- Every 10 years.  Flu shot-Every year.  Take these steps 1. Do not smoke-Your healthcare provider can help you quit.  For tips on how to quit go to www.smokefree.gov or call 1-800 QUITNOW. 2. Be physically active- Exercise 5 days a week for at least 30 minutes.  If you are not already physically active, start slow and gradually work up to 30 minutes of  moderate physical activity.  Examples of moderate activity include walking briskly, dancing, swimming, bicycling, etc. 3. Breast Cancer- A self breast exam every month is important for early detection of breast cancer.  For more information and instruction on self breast exams, ask your healthcare provider or https://www.patel.info/. 4. Eat a healthy diet- Eat a variety of healthy foods such as fruits, vegetables, whole grains, low fat milk, low fat cheeses, yogurt, lean meats, poultry and fish, beans, nuts, tofu, etc.  For more information go to www. Thenutritionsource.org 5. Drink alcohol in moderation- Limit alcohol intake to one drink or less per day. Never drink and drive. 6. Depression- Your emotional health is as important as your physical health.  If you're feeling down or losing interest in things you normally enjoy please talk to your healthcare provider about being screened for depression.  7. Dental visit- Brush and floss your teeth twice daily; visit your dentist twice a year. 8. Eye doctor- Get an eye exam at least every 2 years. 9. Helmet use- Always wear a helmet when riding a bicycle, motorcycle, rollerblading or skateboarding. 4. Safe sex- If you may be exposed to sexually transmitted infections, use a condom. 11. Seat belts- Seat belts can save your live; always wear one. 12. Smoke/Carbon Monoxide detectors- These detectors need to be installed on the appropriate level of your home. Replace batteries at least once a year. 13. Skin cancer- When out in the sun please cover up and use sunscreen 15 SPF or higher. 14. Violence- If anyone is threatening or hurting you, please tell your healthcare provider.         Signed,   Merri Ray, MD [facility] [place].  02/14/19 9:08 PM

## 2019-02-12 NOTE — Patient Instructions (Addendum)
Follow up to discuss prescription treatment for hot flushes if needed.  I will refer you for colonoscopy, but schedule mammogram.  Tetanus can be updated next visit.  If any concerns on labs, will let you know. No change in meds for now. Recheck in 6 months.       If you have lab work done today you will be contacted with your lab results within the next 2 weeks.  If you have not heard from [place] then please contact us. The fastest way to get your results is to register for My Chart.   IF you received an x-ray today, you will receive an invoice from [facility]. Please contact [facility] at [phone] with questions or concerns regarding your invoice.   IF you received labwork today, you will receive an invoice from [place]. Please contact [facility] at [phone] with questions or concerns regarding your invoice.   Our billing staff will not be able to assist you with questions regarding bills from these companies.  You will be contacted with the lab results as soon as they are available. The fastest way to get your results is to activate your My Chart account. Instructions are located on the last page of this paperwork. If you have not heard from [place] regarding the results in 2 weeks, please contact this office.     Keeping You Healthy  Get These Tests 1. Blood Pressure- Have your blood pressure checked once a year by your health care provider.  Normal blood pressure is 120/80. 2. Weight- Have your body mass index (BMI) calculated to screen for obesity.  BMI is measure of body fat based on height and weight.  You can also calculate your own BMI at GravelBags.it. 3. Cholesterol- Have your cholesterol checked every 5 years starting at age 50 then yearly starting at age 65. 47. Chlamydia, HIV, and other sexually transmitted diseases- Get screened every year until age 50, then within three months of each new sexual provider. 5. Pap Test - Every 1-5 years;  discuss with your health care provider. 6. Mammogram- Every 1-2 years starting at age 50--50  Take these medicines  Calcium with Vitamin D-Your body needs 1200 mg of Calcium each day and [phone] IU of Vitamin D daily.  Your body can only absorb 500 mg of Calcium at a time so Calcium must be taken in 2 or 3 divided doses throughout the day.  Multivitamin with folic acid- Once daily if it is possible for you to become pregnant.  Get these Immunizations  Gardasil-Series of three doses; prevents HPV related illness such as genital warts and cervical cancer.  Menactra-Single dose; prevents meningitis.  Tetanus shot- Every 10 years.  Flu shot-Every year.  Take these steps 1. Do not smoke-Your healthcare provider can help you quit.  For tips on how to quit go to www.smokefree.gov or call 1-800 QUITNOW. 2. Be physically active- Exercise 5 days a week for at least 30 minutes.  If you are not already physically active, start slow and gradually work up to 30 minutes of moderate physical activity.  Examples of moderate activity include walking briskly, dancing, swimming, bicycling, etc. 3. Breast Cancer- A self breast exam every month is important for early detection of breast cancer.  For more information and instruction on self breast exams, ask your healthcare provider or https://www.patel.info/. 4. Eat a healthy diet- Eat a variety of healthy foods such as fruits, vegetables, whole grains, low fat milk, low fat cheeses, yogurt, lean meats, poultry  and fish, beans, nuts, tofu, etc.  For more information go to www. Thenutritionsource.org 5. Drink alcohol in moderation- Limit alcohol intake to one drink or less per day. Never drink and drive. 6. Depression- Your emotional health is as important as your physical health.  If you're feeling down or losing interest in things you normally enjoy please talk to your healthcare provider about being screened for depression. 7. Dental  visit- Brush and floss your teeth twice daily; visit your dentist twice a year. 8. Eye doctor- Get an eye exam at least every 2 years. 9. Helmet use- Always wear a helmet when riding a bicycle, motorcycle, rollerblading or skateboarding. 7. Safe sex- If you may be exposed to sexually transmitted infections, use a condom. 11. Seat belts- Seat belts can save your live; always wear one. 12. Smoke/Carbon Monoxide detectors- These detectors need to be installed on the appropriate level of your home. Replace batteries at least once a year. 13. Skin cancer- When out in the sun please cover up and use sunscreen 15 SPF or higher. 14. Violence- If anyone is threatening or hurting you, please tell your healthcare provider.

## 2019-02-13 ENCOUNTER — Encounter: Payer: Self-pay | Admitting: Gastroenterology

## 2019-02-13 LAB — COMPREHENSIVE METABOLIC PANEL
ALT: 10 IU/L (ref 0–32)
AST: 11 IU/L (ref 0–40)
Albumin/Globulin Ratio: 1.6 (ref 1.2–2.2)
Albumin: 4.7 g/dL (ref 3.8–4.8)
Alkaline Phosphatase: 52 IU/L (ref 39–117)
BUN/Creatinine Ratio: 17 (ref 9–23)
BUN: 14 mg/dL (ref 6–24)
Bilirubin Total: 0.4 mg/dL (ref 0.0–1.2)
CO2: 24 mmol/L (ref 20–29)
Calcium: 10.3 mg/dL — ABNORMAL HIGH (ref 8.7–10.2)
Chloride: 100 mmol/L (ref 96–106)
Creatinine, Ser: 0.81 mg/dL (ref 0.57–1.00)
GFR calc Af Amer: 99 mL/min/{1.73_m2} (ref >59)
GFR calc non Af Amer: 86 mL/min/{1.73_m2} (ref >59)
Globulin, Total: 3 g/dL (ref 1.5–4.5)
Glucose: 107 mg/dL — ABNORMAL HIGH (ref 65–99)
Potassium: 3.3 mmol/L — ABNORMAL LOW (ref 3.5–5.2)
Sodium: 140 mmol/L (ref 134–144)
Total Protein: 7.7 g/dL (ref 6.0–8.5)

## 2019-02-13 LAB — CBC
Hematocrit: 41 % (ref 34.0–46.6)
Hemoglobin: 13.8 g/dL (ref 11.1–15.9)
MCH: 30.5 pg (ref 26.6–33.0)
MCHC: 33.7 g/dL (ref 31.5–35.7)
MCV: 91 fL (ref 79–97)
Platelets: 187 10*3/uL (ref 150–450)
RBC: 4.52 x10E6/uL (ref 3.77–5.28)
RDW: 13.7 % (ref 11.7–15.4)
WBC: 6.9 10*3/uL (ref 3.4–10.8)

## 2019-02-13 LAB — LIPID PANEL
Chol/HDL Ratio: 3.6 ratio (ref 0.0–4.4)
Cholesterol, Total: 240 mg/dL — ABNORMAL HIGH (ref 100–199)
HDL: 67 mg/dL (ref >39)
LDL Calculated: 156 mg/dL — ABNORMAL HIGH (ref 0–99)
Triglycerides: 83 mg/dL (ref 0–149)
VLDL Cholesterol Cal: 17 mg/dL (ref 5–40)

## 2019-02-13 LAB — HEMOGLOBIN A1C
Est. average glucose Bld gHb Est-mCnc: 134 mg/dL
Hgb A1c MFr Bld: 6.3 % — ABNORMAL HIGH (ref 4.8–5.6)

## 2019-02-13 LAB — HIV ANTIBODY (ROUTINE TESTING W REFLEX): HIV Screen 4th Generation wRfx: NONREACTIVE

## 2019-02-13 LAB — RPR: RPR Ser Ql: NONREACTIVE

## 2019-02-13 LAB — TSH: TSH: 2.84 u[IU]/mL (ref 0.450–4.500)

## 2019-02-18 LAB — CYTOLOGY - PAP
Chlamydia: NEGATIVE
Diagnosis: NEGATIVE
Neisseria Gonorrhea: NEGATIVE

## 2019-03-13 ENCOUNTER — Other Ambulatory Visit: Payer: Self-pay | Admitting: Family Medicine

## 2019-03-13 DIAGNOSIS — Z1231 Encounter for screening mammogram for malignant neoplasm of breast: Secondary | ICD-10-CM

## 2019-03-17 ENCOUNTER — Encounter: Payer: Managed Care, Other (non HMO) | Admitting: Gastroenterology

## 2019-03-18 ENCOUNTER — Ambulatory Visit
Admission: RE | Admit: 2019-03-18 | Discharge: 2019-03-18 | Disposition: A | Discharge status: Home / Self Care | Payer: Managed Care, Other (non HMO) | Source: Ambulatory Visit | Attending: Family Medicine | Admitting: Family Medicine

## 2019-03-18 ENCOUNTER — Other Ambulatory Visit: Payer: Self-pay

## 2019-03-18 DIAGNOSIS — Z1231 Encounter for screening mammogram for malignant neoplasm of breast: Secondary | ICD-10-CM

## 2019-05-07 ENCOUNTER — Other Ambulatory Visit: Payer: Self-pay

## 2019-05-07 DIAGNOSIS — Z20822 Contact with and (suspected) exposure to covid-19: Secondary | ICD-10-CM

## 2019-05-08 LAB — NOVEL CORONAVIRUS, NAA: SARS-CoV-2, NAA: NOT DETECTED

## 2019-06-16 ENCOUNTER — Other Ambulatory Visit: Payer: Self-pay | Admitting: Family Medicine

## 2019-06-16 DIAGNOSIS — I10 Essential (primary) hypertension: Secondary | ICD-10-CM

## 2019-06-16 MED ORDER — AMLODIPINE BESYLATE 5 MG PO TABS: 2.5000 mg | ORAL_TABLET | Freq: Every day | ORAL | 2 refills | Status: DC

## 2019-06-16 MED ORDER — LISINOPRIL-HYDROCHLOROTHIAZIDE 20-25 MG PO TABS: 1.0000 | ORAL_TABLET | Freq: Every day | ORAL | 2 refills | Status: DC

## 2019-06-16 NOTE — Telephone Encounter (Signed)
Medication Refill - Medication: lisinopril-hydrochlorothiazide (ZESTORETIC) 20-25 M amLODipine (NORVASC) 5 MG tablet      Has the patient contacted their pharmacy? Yes.   (Agent: If no, request that the patient contact the pharmacy for the refill.) (Agent: If yes, when and what did the pharmacy advise?) pharm said they never heard back from office 1 week ago    Preferred Pharmacy (with phone number or street name): [facility]   Agent: Please be advised that RX refills may take up to 3 business days. We ask that you follow-up with your pharmacy.

## 2019-06-19 ENCOUNTER — Encounter: Payer: Self-pay | Admitting: Family Medicine

## 2020-02-06 ENCOUNTER — Telehealth: Payer: Self-pay

## 2020-02-06 NOTE — Telephone Encounter (Signed)
Copied from [place] [phone]. Topic: General - Inquiry >> Jan 22, 2020 12:07 PM Greggory Keen D wrote: Reason for CRM: Pt called wanting to know when her last CPE and TB test was done?  She also wants to know if she can get a copy.  CB# [phone]

## 2020-07-19 ENCOUNTER — Other Ambulatory Visit: Payer: Self-pay | Admitting: Family Medicine

## 2020-07-19 DIAGNOSIS — Z1231 Encounter for screening mammogram for malignant neoplasm of breast: Secondary | ICD-10-CM

## 2020-07-22 ENCOUNTER — Telehealth: Payer: Self-pay

## 2020-07-22 NOTE — Telephone Encounter (Signed)
Pt called reporting they had a dental procedure this afternoon but that their dentist was concerned for high BP of 158/98 pt was advised to take 2nd dose of lisinopril HCTZ before her appt and monitor BP for it to come down prior to appt time. Original dose today was 8:20 am pt reports took second dose wanted to ensure this was okay was advised to monitor pre and post procedure today call with any issues

## 2020-08-01 ENCOUNTER — Other Ambulatory Visit: Payer: Self-pay | Admitting: Family Medicine

## 2020-08-01 DIAGNOSIS — I10 Essential (primary) hypertension: Secondary | ICD-10-CM

## 2020-08-02 NOTE — Telephone Encounter (Signed)
Requested medication (s) are due for refill today: yes  Requested medication (s) are on the active medication list: yes  Last refill:  06/16/19 #90 with 2 refills  Future visit scheduled: no  Notes to clinic:  Please review for refill. Unable to refill per protocol. Patient overdue for OV. Last creatinine level noted in 2017. Last potassium and calcium level noted in 2020.    Requested Prescriptions  Pending Prescriptions Disp Refills   lisinopril-hydrochlorothiazide (ZESTORETIC) 20-25 MG tablet [Pharmacy Med Name: Lisinopril-hydroCHLOROthiazide 20-25 MG Oral Tablet] 90 tablet 0    Sig: Take 1 tablet by mouth once daily      Cardiovascular:  ACEI + Diuretic Combos Failed - 08/01/2020 11:32 AM      Failed - Na in normal range and within 180 days    Sodium  Date Value Ref Range Status  02/12/2019 140 134 - 144 mmol/L Final          Failed - K in normal range and within 180 days    Potassium  Date Value Ref Range Status  02/12/2019 3.3 (L) 3.5 - 5.2 mmol/L Final          Failed - Cr in normal range and within 180 days    Creat  Date Value Ref Range Status  02/18/2016 1.02 0.50 - 1.10 mg/dL Final   Creatinine, Ser  Date Value Ref Range Status  02/12/2019 0.81 0.57 - 1.00 mg/dL Final   Creatinine, Urine  Date Value Ref Range Status  06/02/2008 59.9  Final          Failed - Ca in normal range and within 180 days    Calcium  Date Value Ref Range Status  02/12/2019 10.3 (H) 8.7 - 10.2 mg/dL Final          Failed - Valid encounter within last 6 months    Recent Outpatient Visits           1 year ago Annual physical exam   [facility] [place], Asencion Partridge, MD   2 years ago Essential hypertension   [facility] [place], [place] D, PA-C   2 years ago Essential hypertension   [facility] [place], [place] D, PA-C   2 years ago Essential hypertension   [facility] [place], [place] D, PA-C   2 years ago Encounter for PPD test    [facility] [place], Marolyn Hammock, PA-C                Passed - Patient is not pregnant      Passed - Last BP in normal range    BP Readings from Last 1 Encounters:  02/12/19 138/86

## 2020-08-27 ENCOUNTER — Ambulatory Visit
Admission: RE | Admit: 2020-08-27 | Discharge: 2020-08-27 | Disposition: A | Discharge status: Home / Self Care | Payer: Managed Care, Other (non HMO) | Source: Ambulatory Visit | Attending: Family Medicine | Admitting: Family Medicine

## 2020-08-27 ENCOUNTER — Other Ambulatory Visit: Payer: Self-pay

## 2020-08-27 DIAGNOSIS — Z1231 Encounter for screening mammogram for malignant neoplasm of breast: Secondary | ICD-10-CM

## 2020-12-13 ENCOUNTER — Other Ambulatory Visit: Payer: Self-pay | Admitting: Family Medicine

## 2020-12-13 DIAGNOSIS — I10 Essential (primary) hypertension: Secondary | ICD-10-CM

## 2020-12-22 ENCOUNTER — Other Ambulatory Visit: Payer: Self-pay | Admitting: Family Medicine

## 2020-12-22 DIAGNOSIS — I10 Essential (primary) hypertension: Secondary | ICD-10-CM

## 2020-12-24 ENCOUNTER — Other Ambulatory Visit: Payer: Self-pay | Admitting: Family Medicine

## 2020-12-24 DIAGNOSIS — I10 Essential (primary) hypertension: Secondary | ICD-10-CM

## 2021-03-16 DIAGNOSIS — H40033 Anatomical narrow angle, bilateral: Secondary | ICD-10-CM | POA: Diagnosis not present

## 2021-03-16 DIAGNOSIS — H2513 Age-related nuclear cataract, bilateral: Secondary | ICD-10-CM | POA: Diagnosis not present

## 2021-04-11 ENCOUNTER — Other Ambulatory Visit: Payer: Self-pay | Admitting: Family Medicine

## 2021-04-11 DIAGNOSIS — I10 Essential (primary) hypertension: Secondary | ICD-10-CM

## 2021-06-27 ENCOUNTER — Encounter: Payer: Self-pay | Admitting: Family Medicine

## 2021-06-27 ENCOUNTER — Ambulatory Visit (INDEPENDENT_AMBULATORY_CARE_PROVIDER_SITE_OTHER): Payer: BC Managed Care – PPO | Admitting: Family Medicine

## 2021-06-27 VITALS — BP 136/74 | HR 108 | Temp 98.3°F | Resp 16 | Ht 65.0 in | Wt 172.8 lb

## 2021-06-27 DIAGNOSIS — Z130 Encounter for screening for diseases of the blood and blood-forming organs and certain disorders involving the immune mechanism: Secondary | ICD-10-CM | POA: Diagnosis not present

## 2021-06-27 DIAGNOSIS — Z13220 Encounter for screening for lipoid disorders: Secondary | ICD-10-CM

## 2021-06-27 DIAGNOSIS — I10 Essential (primary) hypertension: Secondary | ICD-10-CM | POA: Diagnosis not present

## 2021-06-27 DIAGNOSIS — Z23 Encounter for immunization: Secondary | ICD-10-CM | POA: Diagnosis not present

## 2021-06-27 DIAGNOSIS — Z0000 Encounter for general adult medical examination without abnormal findings: Secondary | ICD-10-CM

## 2021-06-27 DIAGNOSIS — R7303 Prediabetes: Secondary | ICD-10-CM

## 2021-06-27 DIAGNOSIS — Z131 Encounter for screening for diabetes mellitus: Secondary | ICD-10-CM | POA: Diagnosis not present

## 2021-06-27 DIAGNOSIS — Z1211 Encounter for screening for malignant neoplasm of colon: Secondary | ICD-10-CM

## 2021-06-27 MED ORDER — LISINOPRIL-HYDROCHLOROTHIAZIDE 20-25 MG PO TABS: ORAL_TABLET | ORAL | 2 refills | Status: DC

## 2021-06-27 MED ORDER — AMLODIPINE BESYLATE 5 MG PO TABS
2.5000 mg | ORAL_TABLET | Freq: Every day | ORAL | 2 refills | Status: DC
Start: 2021-06-27 — End: 2021-12-19

## 2021-06-27 NOTE — Progress Notes (Signed)
Subjective:  Patient ID: Carrie Greer, female    DOB: 07/31/1971  Age: 50 y.o. MRN: [id number]  CC:  Chief Complaint  Patient presents with   Annual Exam    Pt here for annual exam will revisit due to arm, due for pap smear, interested in shingles vaccine     HPI Carrie Greer presents for   Annual physical exam, last physical in 2020.  Father passed last year with cancer.  Mom with some dementia, staying with her now.   Hypertension: Amlodipine 2.5 mg daily, lisinopril HCTZ 20/25 mg daily Home readings: 120-130/70-80. No new side effects.  BP Readings from Last 3 Encounters:  06/27/21 136/74  02/12/19 138/86  05/13/18 (!) 150/90   Lab Results  Component Value Date   CREATININE 0.81 02/12/2019   Hyperlipidemia: No current meds, low ASCVD risk for previously.  Diet/exercise approach. No FH of early CAD.  The 10-year ASCVD risk score (Arnett DK, et al., 2019) is: 4.3%   Values used to calculate the score:     Age: 55 years     Sex: Female     Is Non-Hispanic African American: Yes     Diabetic: No     Tobacco smoker: No     Systolic Blood Pressure: 947 mmHg     Is BP treated: Yes     HDL Cholesterol: 67 mg/dL     Total Cholesterol: 240 mg/dL  Lab Results  Component Value Date   CHOL 240 (H) 02/12/2019   HDL 67 02/12/2019   LDLCALC 156 (H) 02/12/2019   TRIG 83 02/12/2019   CHOLHDL 3.6 02/12/2019   Lab Results  Component Value Date   ALT 10 02/12/2019   AST 11 02/12/2019   ALKPHOS 52 02/12/2019   BILITOT 0.4 02/12/2019   Prediabetes: Plan for diet/exercise approach, weight is down 5 pounds from 2020. Watching food choices.  Lab Results  Component Value Date   HGBA1C 6.3 (H) 02/12/2019   Wt Readings from Last 3 Encounters:  06/27/21 172 lb 12.8 oz (78.4 kg)  02/12/19 177 lb 9.6 oz (80.6 kg)  05/13/18 177 lb 9.6 oz (80.6 kg)   Cancer screening: Colon: Screening options with colonoscopy versus Cologuard discussed. Discussed timing  of repeat testing intervals if normal, as well as potential need for diagnostic Colonoscopy if positive Cologuard. Understanding expressed, and chose Colonoscopy.  No FH of colon CA.  Mammogram without evidence of malignancy on 08/30/2020 Pap testing 02/12/2019, negative, repeat 3 years.  Immunization History  Administered Date(s) Administered   PPD Test 08/05/2015, 08/10/2015, 08/30/2017, 09/03/2017   Tdap 12/30/2007  Flu vaccine: today.  COVID-vaccine: initial vaccine in August. Booster recommended - she will [facility].  Shingles vaccine: 1st dose today.   No results found. Optho eval few months ago.   Dental:  Every 6 months.   Alcohol: Wine 1 drink per week.   Tobacco: None.   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 Some home exercises. goal  History Patient Active Problem List   Diagnosis Date Noted   Essential hypertension 06/24/2014   Past Medical History:  Diagnosis Date   Gestational diabetes 2009   Hypertension    Past Surgical History:  Procedure Laterality Date   CESAREAN SECTION     TUBAL LIGATION     No Known Allergies Prior to Admission medications   Medication Sig Start Date End Date Taking? Authorizing Provider  amLODipine (NORVASC) 5 MG tablet Take 0.5 tablets (2.5 mg  total) by mouth daily. 06/16/19  Yes Wendie Agreste, MD  lisinopril-hydrochlorothiazide (ZESTORETIC) 20-25 MG tablet TAKE 1 TABLET BY MOUTH ONCE DAILY(NEEDS APPT FOR MORE REFILLS) 04/11/21  Yes Wendie Agreste, MD   Social History   Socioeconomic History   Marital status: Married    Spouse name: Darcus Austin   Number of children: 7   Years of education: college   Highest education level: Not on file  Occupational History   Occupation: Childcare    Comment: Teacher, early years/pre  Tobacco Use   Smoking status: Never   Smokeless tobacco: Never  Vaping Use   Vaping Use: Never used  Substance and Sexual Activity   Alcohol use: Yes     Alcohol/week: 0.0 - 3.0 standard drinks    Comment: wine on the weekends, reports not weekly   Drug use: No   Sexual activity: Yes    Partners: Male    Birth control/protection: Surgical    Comment: BTL 2009 after the birth of triplets, sexually active with monogamous partner  Other Topics Concern   Not on file  Social History Narrative   Pt is from [place]. Has been in [place] since 2005.   Just graduated in 11/2016 at [employer] with degree in Early Childhood Development.Continuing on now for BA at [employer].   Lives with her husband and their 4 youngest children (triplets and a 50 year old). Has 3 other grown children. (7 children total).       Exercise includes walking, stair climbing, playing with the children.       Diet includes baked chicken, fish, potatoes, salads and greens. Likes water and minute maid OJ. Gets enough dairy through milk, cheese, and yogurt.       Social Determinants of Health   Financial Resource Strain: Not on file  Food Insecurity: Not on file  Transportation Needs: Not on file  Physical Activity: Not on file  Stress: Not on file  Social Connections: Not on file  Intimate Partner Violence: Not on file    Review of Systems 13 point review of systems per patient health survey noted.  Negative other than as indicated above or in HPI.     Objective:   Vitals:   06/27/21 1414  BP: 136/74  Pulse: (!) 108  Resp: 16  Temp: 98.3 F (36.8 C)  TempSrc: Temporal  SpO2: 92%  Weight: 172 lb 12.8 oz (78.4 kg)  Height: 5\' 5"  (1.651 m)     Physical Exam Constitutional:      Appearance: She is well-developed.  HENT:     Head: Normocephalic and atraumatic.     Right Ear: External ear normal.     Left Ear: External ear normal.  Eyes:     Conjunctiva/sclera: Conjunctivae normal.     Pupils: Pupils are equal, round, and reactive to light.  Neck:     Thyroid: No thyromegaly.  Cardiovascular:     Rate and Rhythm: Normal rate and regular rhythm.      Heart sounds: Normal heart sounds. No murmur heard. Pulmonary:     Effort: Pulmonary effort is normal. No respiratory distress.     Breath sounds: Normal breath sounds. No wheezing.  Abdominal:     General: Bowel sounds are normal.     Palpations: Abdomen is soft.     Tenderness: There is no abdominal tenderness.  Musculoskeletal:        General: No tenderness. Normal range of motion.     Cervical back: Normal range  of motion and neck supple.  Lymphadenopathy:     Cervical: No cervical adenopathy.  Skin:    General: Skin is warm and dry.     Findings: No rash.  Neurological:     Mental Status: She is alert and oriented to person, place, and time.  Psychiatric:        Behavior: Behavior normal.        Thought Content: Thought content normal.     Assessment & Plan:  Ronin Crager is a 50 y.o. female . Annual physical exam - Plan: CBC with Differential/Platelet, Lipid panel, Hemoglobin A1c, Comprehensive metabolic panel  - -anticipatory guidance as below in AVS, screening labs above. Health maintenance items as above in HPI discussed/recommended as applicable.   Screening for diabetes mellitus - Plan: Hemoglobin A1c, Comprehensive metabolic panel  Screening for hyperlipidemia - Plan: Lipid panel, Comprehensive metabolic panel  Screening for deficiency anemia - Plan: CBC with Differential/Platelet, Comprehensive metabolic panel  Essential hypertension - Plan: amLODipine (NORVASC) 5 MG tablet, lisinopril-hydrochlorothiazide (ZESTORETIC) 20-25 MG tablet  -  Stable, tolerating current regimen. Medications refilled. Labs pending as above.   Prediabetes - Plan: Hemoglobin A1c  -Continue to watch diet, exercise, check A1c.  73-month follow-up.  Screen for colon cancer - Plan: Ambulatory referral to Gastroenterology  Needs flu shot - Plan: Flu Vaccine QUAD 67mo+IM (Fluarix, Fluzone & Alfiuria Quad PF)  Need for shingles vaccine - Plan: Varicella-zoster vaccine IM   Meds  ordered this encounter  Medications   amLODipine (NORVASC) 5 MG tablet    Sig: Take 0.5 tablets (2.5 mg total) by mouth daily.    Dispense:  45 tablet    Refill:  2   lisinopril-hydrochlorothiazide (ZESTORETIC) 20-25 MG tablet    Sig: TAKE 1 TABLET BY MOUTH ONCE DAILY    Dispense:  90 tablet    Refill:  2   There are no Patient Instructions on file for this visit.    Signed,   Merri Ray, MD [place] 06/27/21 3:06 PM

## 2021-06-28 LAB — COMPREHENSIVE METABOLIC PANEL
ALT: 12 U/L (ref 0–35)
AST: 13 U/L (ref 0–37)
Albumin: 4.7 g/dL (ref 3.5–5.2)
Alkaline Phosphatase: 54 U/L (ref 39–117)
BUN: 17 mg/dL (ref 6–23)
CO2: 34 mEq/L — ABNORMAL HIGH (ref 19–32)
Calcium: 10.5 mg/dL (ref 8.4–10.5)
Chloride: 98 mEq/L (ref 96–112)
Creatinine, Ser: 1.01 mg/dL (ref 0.40–1.20)
GFR: 64.26 mL/min (ref >60.00)
Glucose, Bld: 71 mg/dL (ref 70–99)
Potassium: 3.3 mEq/L — ABNORMAL LOW (ref 3.5–5.1)
Sodium: 139 mEq/L (ref 135–145)
Total Bilirubin: 0.6 mg/dL (ref 0.2–1.2)
Total Protein: 7.6 g/dL (ref 6.0–8.3)

## 2021-06-28 LAB — HEMOGLOBIN A1C: Hgb A1c MFr Bld: 6.5 % (ref 4.6–6.5)

## 2021-06-28 LAB — LIPID PANEL
Cholesterol: 262 mg/dL — ABNORMAL HIGH (ref 0–200)
HDL: 63 mg/dL (ref >39.00)
LDL Cholesterol: 176 mg/dL — ABNORMAL HIGH (ref 0–99)
NonHDL: 198.8
Total CHOL/HDL Ratio: 4
Triglycerides: 112 mg/dL (ref 0.0–149.0)
VLDL: 22.4 mg/dL (ref 0.0–40.0)

## 2021-06-29 ENCOUNTER — Telehealth: Payer: Self-pay | Admitting: Family Medicine

## 2021-06-29 NOTE — Telephone Encounter (Signed)
Pt called in stating if she is still to be taking the lisinipril and the fluid pill ago with the amlodipine?   She states that the Amlodipine is new.  Please advise   Pt states she can't answer her phone while she is in class so you can Leave a detailed message or she can call back.  Pt can be reached at the home #

## 2021-06-30 NOTE — Telephone Encounter (Signed)
Based on her last visit blood pressure was controlled.  If she was taking both the combination lisinopril HCTZ and amlodipine, should remain on same regimen to maintain control.  Often more than 1 medication is needed to control hypertension.  If she was not taking both medications, let me know what she was taking at that visit.

## 2021-06-30 NOTE — Telephone Encounter (Signed)
On chart review it appears she was on amlodipine back in 2020.  If she was only taking the lisinopril hydrochlorothiazide at her last visit, based on her blood pressure last visit okay to remain on that medication alone.  If she does have some blood pressures over 140/90, add the amlodipine.   Let me know if there are further questions.

## 2021-06-30 NOTE — Telephone Encounter (Signed)
When I spoke to patient yesterday she stated that the amlodipine was new and she was previously only taking the lisinopril

## 2021-07-01 NOTE — Telephone Encounter (Signed)
Lvm for patient to return my call

## 2021-07-04 ENCOUNTER — Other Ambulatory Visit (INDEPENDENT_AMBULATORY_CARE_PROVIDER_SITE_OTHER): Payer: BC Managed Care – PPO

## 2021-07-04 DIAGNOSIS — Z130 Encounter for screening for diseases of the blood and blood-forming organs and certain disorders involving the immune mechanism: Secondary | ICD-10-CM | POA: Diagnosis not present

## 2021-07-04 LAB — CBC
HCT: 36.4 % (ref 36.0–46.0)
Hemoglobin: 12.4 g/dL (ref 12.0–15.0)
MCHC: 34.2 g/dL (ref 30.0–36.0)
MCV: 88 fl (ref 78.0–100.0)
Platelets: 202 10*3/uL (ref 150.0–400.0)
RBC: 4.14 Mil/uL (ref 3.87–5.11)
RDW: 13.5 % (ref 11.5–15.5)
WBC: 7.4 10*3/uL (ref 4.0–10.5)

## 2021-10-03 ENCOUNTER — Telehealth: Payer: BC Managed Care – PPO | Admitting: Family Medicine

## 2021-10-03 ENCOUNTER — Encounter: Payer: Self-pay | Admitting: Family Medicine

## 2021-10-03 DIAGNOSIS — F4321 Adjustment disorder with depressed mood: Secondary | ICD-10-CM

## 2021-10-03 DIAGNOSIS — F5104 Psychophysiologic insomnia: Secondary | ICD-10-CM

## 2021-10-03 MED ORDER — HYDROXYZINE HCL 10 MG PO TABS
10.0000 mg | ORAL_TABLET | Freq: Three times a day (TID) | ORAL | 0 refills | Status: DC | PRN
Start: 2021-10-03 — End: 2021-10-26

## 2021-10-03 NOTE — Progress Notes (Signed)
Virtual Visit via Video Note ? ?I connected with Carrie Greer on 10/03/21 at 4:54 PM by a video enabled telemedicine application and verified that I am speaking with the correct person using two identifiers.  ?Patient location: home, by self. ?My location: office - [place].  ?  ?I discussed the limitations, risks, security and privacy concerns of performing an evaluation and management service by telephone and the availability of in person appointments. I also discussed with the patient that there may be a patient responsible charge related to this service. The patient expressed understanding and agreed to proceed, consent obtained ? ?Chief complaint: ? ?Chief Complaint  ?Patient presents with  ? Depression  ?  Pt mother has passed recently on 11/12/2022 she requested more time off for bereavement ?RTW 10/17/2021 continuous leave 09/12/2021 until 10/17/2021    ? ? ?History of Present Illness: ?Carrie Greer is a 51 y.o. female ? ?Depression/grieving: ?Mom passed away 2021/10/07. Suddenly passed. ?Funeral 2/25, laid to rest at [facility] base.  ?Out of work 2/13 until now. Overseeing details of burial. Used vacation and bereavement of 3 days. Option of FMLA. Plan to return on 3/20.  Trouble sleeping. Waking up at night, tearful. ?Support structure - dtr is a psychiatrist - sessions and discussing grieving with her dtr and husband.  ?Does have counseling available at job if needed, but not needed at this time.  ?No current meds ? ? ?Patient Active Problem List  ? Diagnosis Date Noted  ? Essential hypertension 06/24/2014  ? ?Past Medical History:  ?Diagnosis Date  ? Gestational diabetes 2009  ? Hypertension   ? ?Past Surgical History:  ?Procedure Laterality Date  ? CESAREAN SECTION    ? TUBAL LIGATION    ? ?No Known Allergies ?Prior to Admission medications   ?Medication Sig Start Date End Date Taking? Authorizing Provider  ?amLODipine (NORVASC) 5 MG tablet Take 0.5 tablets (2.5 mg total) by  mouth daily. 06/27/21   Wendie Agreste, MD  ?lisinopril-hydrochlorothiazide (ZESTORETIC) 20-25 MG tablet TAKE 1 TABLET BY MOUTH ONCE DAILY 06/27/21   Wendie Agreste, MD  ? ?Social History  ? ?Socioeconomic History  ? Marital status: Married  ?  Spouse name: Darcus Austin  ? Number of children: 7  ? Years of education: college  ? Highest education level: Not on file  ?Occupational History  ? Occupation: Childcare  ?  Comment: [employer]  ?Tobacco Use  ? Smoking status: Never  ? Smokeless tobacco: Never  ?Vaping Use  ? Vaping Use: Never used  ?Substance and Sexual Activity  ? Alcohol use: Yes  ?  Alcohol/week: 0.0 - 3.0 standard drinks  ?  Comment: wine on the weekends, reports not weekly  ? Drug use: No  ? Sexual activity: Yes  ?  Partners: Male  ?  Birth control/protection: Surgical  ?  Comment: BTL 2009 after the birth of triplets, sexually active with monogamous partner  ?Other Topics Concern  ? Not on file  ?Social History Narrative  ? Pt is from [place]. Has been in [place] since 2005.  ? Just graduated in 11/2016 at [employer] with degree in Early Childhood Development.Continuing on now for BA at [employer].  ? Lives with her husband and their 4 youngest children (triplets and a 13 year old). Has 3 other grown children. (7 children total).   ?   ? Exercise includes walking, stair climbing, playing with the children.   ?   ? Diet includes baked chicken,  fish, potatoes, salads and greens. Likes water and minute maid OJ. Gets enough dairy through milk, cheese, and yogurt.   ?   ? ?Social Determinants of Health  ? ?Financial Resource Strain: Not on file  ?Food Insecurity: Not on file  ?Transportation Needs: Not on file  ?Physical Activity: Not on file  ?Stress: Not on file  ?Social Connections: Not on file  ?Intimate Partner Violence: Not on file  ? ? ?Observations/Objective: ?There were no vitals filed for this visit. ?Nontoxic appearance on video. ?Speaking in full sentences, no respiratory  distress.  Appropriate responses.  Tearful during times of history, discussion, appropriate to conversation.  All questions answered with understanding of plan expressed. ? ?Assessment and Plan: ?Grieving - Plan: hydrOXYzine (ATARAX) 10 MG tablet ? ?Psychophysiological insomnia - Plan: hydrOXYzine (ATARAX) 10 MG tablet ?Appropriate grieving.,  With some insomnia, situational stressors/anxiety.  Trial of hydroxyzine with potential side effects discussed.  Handout given on grieving/loss.  Support structure in place at this time and declines other numbers for counseling.  Plan for return to work on 10/17/2021 and will complete FMLA paperwork once received.  Advised to call/follow-up during that time if any new or worsening symptoms, or with perceived difficulty with return to work so we can discuss possible changes. ? ?Follow Up Instructions: ?As needed. ?  ?I discussed the assessment and treatment plan with the patient. The patient was provided an opportunity to ask questions and all were answered. The patient agreed with the plan and demonstrated an understanding of the instructions. ?  ?The patient was advised to call back or seek an in-person evaluation if the symptoms worsen or if the condition fails to improve as anticipated. ? ? ?Wendie Agreste, MD ? ?

## 2021-10-03 NOTE — Patient Instructions (Signed)
Again, I am sorry about your loss.  I sent hydroxyzine to your pharmacy to use at bedtime if needed to help get to sleep or can be used during the day if needed for anxiety.  Please let me know if I can help further.  If you are unable to send the [facility] paperwork electronically, can bring forms by here in person and I am happy to complete them.  I will send a message to our administrative staff to see if they can help guide you on submitting paperwork through [facility].  Take care. ? ?Dr. Carlota Raspberry ? ?Managing Loss, Adult ?People experience loss in many different ways throughout their lives. Events such as moving, changing jobs, and losing friends can create a sense of loss. The loss may be as serious as a major health change, divorce, death of a pet, or death of a loved one. All of these types of loss are likely to create a physical and emotional reaction known as grief. Grief is the result of a major change or an absence of something or someone that you count on. Grief is a normal reaction to loss. ?A variety of factors can affect your grieving experience, including: ?The nature of your loss. ?Your relationship to what or whom you lost. ?Your understanding of grief and how to manage it. ?Your support system. ?Be aware that when grief becomes extreme, it can lead to more severe issues like isolation, depression, anxiety, or suicidal thoughts. Talk with your health care provider if you have any of these issues. ?How to manage lifestyle changes ?Keep to your normal routine as much as possible. ?If you have trouble focusing or doing normal activities, it is acceptable to take some time away from your normal routine. ?Spend time with friends and loved ones. ?Eat a healthy diet, get plenty of sleep, and rest when you feel tired. ?How to recognize changes  ?The way that you deal with your grief will affect your ability to function as you normally do. When grieving, you may experience these changes: ?Numbness, shock, sadness,  anxiety, anger, denial, and guilt. ?Thoughts about death. ?Unexpected crying. ?A physical sensation of emptiness in your stomach. ?Problems sleeping and eating. ?Tiredness (fatigue). ?Loss of interest in normal activities. ?Dreaming about or imagining seeing the person who died. ?A need to remember what or whom you lost. ?Difficulty thinking about anything other than your loss for a period of time. ?Relief. If you have been expecting the loss for a while, you may feel a sense of relief when it happens. ?Follow these instructions at home: ?Activity ?Express your feelings in healthy ways, such as: ?Talking with others about your loss. It may be helpful to find others who have had a similar loss, such as a support group. ?Writing down your feelings in a journal. ?Doing physical activities to release stress and emotional energy. ?Doing creative activities like painting, sculpting, or playing or listening to music. ?Practicing resilience. This is the ability to recover and adjust after facing challenges. Reading some resources that encourage resilience may help you to learn ways to practice those behaviors. ? ?General instructions ?Be patient with yourself and others. Allow the grieving process to happen, and remember that grieving takes time. ?It is likely that you may never feel completely done with some grief. You may find a way to move on while still cherishing memories and feelings about your loss. ?Accepting your loss is a process. It can take months or longer to adjust. ?Keep all follow-up visits.  This is important. ?Where to find support ?To get support for managing loss: ?Ask your health care provider for help and recommendations, such as grief counseling or therapy. ?Think about joining a support group for people who are managing a loss. ?Where to find more information ?You can find more information about managing loss from: ?American Society of Clinical Oncology: www.cancer.net ?American Psychological  Association: TVStereos.ch ?Contact a health care provider if: ?Your grief is extreme and keeps getting worse. ?You have ongoing grief that does not improve. ?Your body shows symptoms of grief, such as illness. ?You feel depressed, anxious, or hopeless. ?Get help right away if: ?You have thoughts about hurting yourself or others. ?Get help right away if you feel like you may hurt yourself or others, or have thoughts about taking your own life. Go to your nearest emergency room or: ?Call 911. ?Call the [facility] at 5343923254 or 988. This is open 24 hours a day. ?Text the Crisis Text Line at 509-542-2484. ?Summary ?Grief is the result of a major change or an absence of someone or something that you count on. Grief is a normal reaction to loss. ?The depth of grief and the period of recovery depend on the type of loss and your ability to adjust to the change and process your feelings. ?Processing grief requires patience and a willingness to accept your feelings and talk about your loss with people who are supportive. ?It is important to find resources that work for you and to realize that people experience grief differently. There is not one grieving process that works for everyone in the same way. ?Be aware that when grief becomes extreme, it can lead to more severe issues like isolation, depression, anxiety, or suicidal thoughts. Talk with your health care provider if you have any of these issues. ?This information is not intended to replace advice given to you by your health care provider. Make sure you discuss any questions you have with your health care provider. ?Document Revised: 03/07/2021 Document Reviewed: 03/07/2021 ?Elsevier Patient Education ? [place]. ? ?

## 2021-10-04 ENCOUNTER — Telehealth: Payer: Self-pay

## 2021-10-04 NOTE — Telephone Encounter (Signed)
Save for when paperwork is completed. See Mychart Message as well for further detail  ?

## 2021-10-04 NOTE — Telephone Encounter (Signed)
Placed in your to be signed folder  ?

## 2021-10-04 NOTE — Telephone Encounter (Signed)
Caller name:Carrie Greer  ? ?On DPR? :Yes ? ?Call back number: ? ?Provider they see: Carlota Raspberry ? ?Reason for call:Fax [phone] pt called back with this fax number for Human Resources pt had a phone appt with Dr Carlota Raspberry 03/06 and was told to call back with fax number to where to send paperwork to   ? ?

## 2021-10-05 NOTE — Telephone Encounter (Signed)
Completed, placed in fax folder.

## 2021-10-05 NOTE — Telephone Encounter (Signed)
Called LM informing pt this has been faxed  ?

## 2021-10-05 NOTE — Telephone Encounter (Signed)
Faxed back to the requested fax number ? ?

## 2021-10-19 ENCOUNTER — Telehealth: Payer: Self-pay

## 2021-10-19 NOTE — Telephone Encounter (Signed)
Placed updated disability form [employer] in bin to be completed and fax back  ?

## 2021-10-19 NOTE — Telephone Encounter (Signed)
Filled out form to best of my ability, placed in folder to sign ?

## 2021-10-21 NOTE — Telephone Encounter (Signed)
Will check with Dr Carlota Raspberry on status before end of day.  ? ?

## 2021-10-21 NOTE — Telephone Encounter (Signed)
Done. Ready for fax.  ?

## 2021-10-21 NOTE — Telephone Encounter (Signed)
Received a call from Carrie Greer - [place] and she is calling in about the forms that we have received, asked for an update. Advised that we we have received the form and we were awaiting the provider to sign. ?

## 2021-10-21 NOTE — Telephone Encounter (Signed)
faxed

## 2021-10-26 ENCOUNTER — Encounter: Payer: Self-pay | Admitting: Family Medicine

## 2021-10-26 ENCOUNTER — Encounter: Payer: Self-pay | Admitting: Gastroenterology

## 2021-10-26 ENCOUNTER — Ambulatory Visit: Payer: BC Managed Care – PPO | Admitting: Family Medicine

## 2021-10-26 VITALS — BP 132/76 | HR 74 | Temp 97.8°F | Resp 17 | Ht 65.0 in | Wt 172.4 lb

## 2021-10-26 DIAGNOSIS — F4321 Adjustment disorder with depressed mood: Secondary | ICD-10-CM

## 2021-10-26 DIAGNOSIS — F5104 Psychophysiologic insomnia: Secondary | ICD-10-CM

## 2021-10-26 MED ORDER — HYDROXYZINE HCL 10 MG PO TABS: 10.0000 mg | ORAL_TABLET | Freq: Three times a day (TID) | ORAL | 0 refills | Status: DC | PRN

## 2021-10-26 NOTE — Progress Notes (Signed)
? ?Subjective:  ?Patient ID: Aniyia Rane, female    DOB: [birth year]  Age: 51 y.o. MRN: [id number] ? ?CC:  ?Chief Complaint  ?Patient presents with  ? Form Completion  ?  Pt needs new letter stating she can return to work without restrictions and can return to normal activities new RTW date 11/14/21  ? ? ?HPI ?Dyanne Carrel presents for  ? ?Grieving with insomnia. ?Discussed on video visit 10/03/2021.  Some difficulty with sleep at that time, stressors with the passing of her mom suddenly February 11.  FMLA paperwork completed for further time out of work with return planned on 10/17/2021.  Did provide prescription for hydroxyzine to help with sleep or anxiety.  She did report that her daughter was a psychiatrist and had sessions and discussed grieving with her daughter and husband.  She did have counseling available at her job but declined need at that last visit. ? ?Tried to return to work on 10/17/21. Told needed a note that stated no restrictions and able to return to normal daily activities.  ?Sleeping ok - taking hydroxyzine 2 times per day. Feels some anxiety in the morning, depressed at times. Hydroxyzine helping. ?Dtr is psychologist - meeting with her on 10/31/21. Feels like talking to her is going well.  ?Works at [employer]. Work is closed during spring break, with plan to have her work somewhere else during that time.  ?Court probate appt on 4/4 for mom's property. ?Feels ready to return to work. ? ?  06/27/2021  ?  2:10 PM 02/12/2019  ? 11:40 AM 05/13/2018  ?  3:24 PM 12/27/2017  ?  4:38 PM 12/12/2017  ?  4:44 PM  ?Depression screen PHQ 2/9  ?Decreased Interest 0 0 0 0 0  ?Down, Depressed, Hopeless 0 0 0 0 0  ?PHQ - 2 Score 0 0 0 0 0  ?Altered sleeping 0      ?Tired, decreased energy 0      ?Change in appetite 0      ?Feeling bad or failure about yourself  0      ?Trouble concentrating 0      ?Moving slowly or fidgety/restless 0      ?Suicidal thoughts 0      ?PHQ-9 Score 0       ? ? ? ? ? ? ?History ?Patient Active Problem List  ? Diagnosis Date Noted  ? Essential hypertension 06/24/2014  ? ?Past Medical History:  ?Diagnosis Date  ? Gestational diabetes 2009  ? Hypertension   ? ?Past Surgical History:  ?Procedure Laterality Date  ? CESAREAN SECTION    ? TUBAL LIGATION    ? ?No Known Allergies ?Prior to Admission medications   ?Medication Sig Start Date End Date Taking? Authorizing Provider  ?amLODipine (NORVASC) 5 MG tablet Take 0.5 tablets (2.5 mg total) by mouth daily. 06/27/21  Yes Wendie Agreste, MD  ?hydrOXYzine (ATARAX) 10 MG tablet Take 1 tablet (10 mg total) by mouth 3 (three) times daily as needed for anxiety (or sleep). 10/03/21  Yes Wendie Agreste, MD  ?lisinopril-hydrochlorothiazide (ZESTORETIC) 20-25 MG tablet TAKE 1 TABLET BY MOUTH ONCE DAILY 06/27/21  Yes Wendie Agreste, MD  ? ?Social History  ? ?Socioeconomic History  ? Marital status: Married  ?  Spouse name: Darcus Austin  ? Number of children: 7  ? Years of education: college  ? Highest education level: Not on file  ?Occupational History  ? Occupation: Childcare  ?  Comment: [place]  ?Tobacco Use  ? Smoking status: Never  ? Smokeless tobacco: Never  ?Vaping Use  ? Vaping Use: Never used  ?Substance and Sexual Activity  ? Alcohol use: Yes  ?  Alcohol/week: 0.0 - 3.0 standard drinks  ?  Comment: wine on the weekends, reports not weekly  ? Drug use: No  ? Sexual activity: Yes  ?  Partners: Male  ?  Birth control/protection: Surgical  ?  Comment: BTL 2009 after the birth of triplets, sexually active with monogamous partner  ?Other Topics Concern  ? Not on file  ?Social History Narrative  ? Pt is from [place]. Has been in [place] since 2005.  ? Just graduated in 11/2016 at [employer] with degree in Early Childhood Development.Continuing on now for BA at [employer].  ? Lives with her husband and their 4 youngest children (triplets and a 30 year old). Has 3 other grown children. (7 children total).   ?   ?  Exercise includes walking, stair climbing, playing with the children.   ?   ? Diet includes baked chicken, fish, potatoes, salads and greens. Likes water and minute maid OJ. Gets enough dairy through milk, cheese, and yogurt.   ?   ? ?Social Determinants of Health  ? ?Financial Resource Strain: Not on file  ?Food Insecurity: Not on file  ?Transportation Needs: Not on file  ?Physical Activity: Not on file  ?Stress: Not on file  ?Social Connections: Not on file  ?Intimate Partner Violence: Not on file  ? ? ?Review of Systems ? ?Per HPI.  ?Objective:  ? ?Vitals:  ? 10/26/21 1324  ?BP: 132/76  ?Pulse: 74  ?Resp: 17  ?Temp: 97.8 ?F (36.6 ?C)  ?TempSrc: Temporal  ?Weight: 172 lb 6.4 oz (78.2 kg)  ?Height: '5\' 5"'$  (1.651 m)  ? ? ? ?Physical Exam ?Vitals reviewed.  ?Constitutional:   ?   General: She is not in acute distress. ?   Appearance: Normal appearance. She is well-developed.  ?HENT:  ?   Head: Normocephalic and atraumatic.  ?Cardiovascular:  ?   Rate and Rhythm: Normal rate.  ?Pulmonary:  ?   Effort: Pulmonary effort is normal.  ?Neurological:  ?   Mental Status: She is alert and oriented to person, place, and time.  ?Psychiatric:     ?   Attention and Perception: Attention and perception normal.     ?   Mood and Affect: Mood and affect normal.     ?   Speech: Speech normal.     ?   Behavior: Behavior normal.     ?   Thought Content: Thought content normal.  ? ? ? ? ? ?Assessment & Plan:  ?Taimane Stimmel is a 51 y.o. female . ?Grieving ? ?Psychophysiological insomnia ?Improved and was ready to return to work on the 20th, but did need further clarification on her return to work paperwork.  Still some anxiety/depression symptoms in the morning, does have an appointment with counseling April 3, and then probate court appointment April 4.  We will have her return to work April 5 with no restrictions.  Recheck 1 month if still need for hydroxyzine, with tapering of dosing of hydroxyzine, initially in the morning  discussed.  RTC precautions if worse symptoms.  Also advised to let me know if other resources needed. ? ?No orders of the defined types were placed in this encounter. ? ?Patient Instructions  ?Ok to lessen use of hydroxyzine to just at  bedtime if needed as anxiety symptoms improve. If still needing hydroxyzine in next month, follow up to discuss plan further.  ?Let me know if other resources needed. ?Thanks for coming in today.  ? ? ? ?Signed,  ? ?Merri Ray, MD ?[place] ?[place] ?10/26/21 ?2:27 PM ? ? ?

## 2021-10-26 NOTE — Patient Instructions (Addendum)
Ok to lessen use of hydroxyzine to just at bedtime if needed as anxiety symptoms improve. If still needing hydroxyzine in next month, follow up to discuss plan further.  ?Let me know if other resources needed. ?Thanks for coming in today.  ?

## 2021-11-01 ENCOUNTER — Telehealth: Payer: Self-pay | Admitting: Family Medicine

## 2021-11-01 NOTE — Telephone Encounter (Signed)
Pt called in stating that she needs her last office note and her return to work note sent into [employer] short term disability. She states that her return to work date is tomorrow. Please advise  ? ? ?

## 2021-11-02 NOTE — Telephone Encounter (Signed)
Faxed attn Melissa her case worker  ?

## 2021-11-11 ENCOUNTER — Ambulatory Visit (AMBULATORY_SURGERY_CENTER): Payer: BC Managed Care – PPO

## 2021-11-11 VITALS — Ht 65.0 in | Wt 174.0 lb

## 2021-11-11 DIAGNOSIS — Z1211 Encounter for screening for malignant neoplasm of colon: Secondary | ICD-10-CM

## 2021-11-11 MED ORDER — NA SULFATE-K SULFATE-MG SULF 17.5-3.13-1.6 GM/177ML PO SOLN: 1.0000 | Freq: Once | ORAL | 0 refills | Status: AC

## 2021-11-11 MED ORDER — ONDANSETRON HCL 4 MG PO TABS: 4.0000 mg | ORAL_TABLET | ORAL | 0 refills | Status: DC

## 2021-11-11 NOTE — Progress Notes (Signed)
No egg or soy allergy known to patient  ?No issues known to pt with past sedation with any surgeries or procedures ?Patient denies ever being told they had issues or difficulty with intubation  ?No FH of Malignant Hyperthermia ?Pt is not on diet pills ?Pt is not on  home 02  ?Pt is not on blood thinners  ?Pt denies issues with constipation  ?No A fib or A flutter ? ? NO PA's for preps discussed with pt In PV today  ?Discussed with pt there will be an out-of-pocket cost for prep and that varies from $0 to 70 +  dollars - pt verbalized understanding  ?Pt instructed to use Singlecare.com or GoodRx for a price reduction on prep  ? ?PV completed over the phone. Pt verified name, DOB, address and insurance during PV today.  ?Pt mailed instruction packet with copy of consent form to read and not return, and instructions.  ?Pt encouraged to call with questions or issues.  ?If pt has My chart, procedure instructions sent via My Chart  ? ?

## 2021-11-16 ENCOUNTER — Encounter: Payer: Self-pay | Admitting: Gastroenterology

## 2021-11-23 NOTE — Telephone Encounter (Signed)
Re-faxed.

## 2021-11-23 NOTE — Telephone Encounter (Signed)
Patient is calling in stating that [place] never received the last OV notes, only received a note that says she can return to work. Patient is asking if we can send this to them.  ?

## 2021-11-25 ENCOUNTER — Encounter: Payer: Self-pay | Admitting: Gastroenterology

## 2021-11-25 ENCOUNTER — Ambulatory Visit (AMBULATORY_SURGERY_CENTER): Payer: BC Managed Care – PPO | Admitting: Gastroenterology

## 2021-11-25 VITALS — BP 128/98 | HR 86 | Temp 97.5°F | Resp 16 | Ht 65.0 in | Wt 174.0 lb

## 2021-11-25 DIAGNOSIS — Z1211 Encounter for screening for malignant neoplasm of colon: Secondary | ICD-10-CM | POA: Diagnosis not present

## 2021-11-25 MED ORDER — SODIUM CHLORIDE 0.9 % IV SOLN: 500.0000 mL | Freq: Once | INTRAVENOUS | Status: DC

## 2021-11-25 NOTE — Patient Instructions (Signed)
Resume previous diet and medications. Repeat Colonoscopy in 10 years for screening. ? ?YOU HAD AN ENDOSCOPIC PROCEDURE TODAY AT [facility]:   Refer to the procedure report that was given to you for any specific questions about what was found during the examination.  If the procedure report does not answer your questions, please call your gastroenterologist to clarify.  If you requested that your care partner not be given the details of your procedure findings, then the procedure report has been included in a sealed envelope for you to review at your convenience later. ? ?YOU SHOULD EXPECT: Some feelings of bloating in the abdomen. Passage of more gas than usual.  Walking can help get rid of the air that was put into your GI tract during the procedure and reduce the bloating. If you had a lower endoscopy (such as a colonoscopy or flexible sigmoidoscopy) you may notice spotting of blood in your stool or on the toilet paper. If you underwent a bowel prep for your procedure, you may not have a normal bowel movement for a few days. ? ?Please Note:  You might notice some irritation and congestion in your nose or some drainage.  This is from the oxygen used during your procedure.  There is no need for concern and it should clear up in a day or so. ? ?SYMPTOMS TO REPORT IMMEDIATELY: ? ?Following lower endoscopy (colonoscopy or flexible sigmoidoscopy): ? Excessive amounts of blood in the stool ? Significant tenderness or worsening of abdominal pains ? Swelling of the abdomen that is new, acute ? Fever of 100?F or higher ? ?For urgent or emergent issues, a gastroenterologist can be reached at any hour by calling [phone]. ?Do not use MyChart messaging for urgent concerns.  ? ? ?DIET:  We do recommend a small meal at first, but then you may proceed to your regular diet.  Drink plenty of fluids but you should avoid alcoholic beverages for 24 hours. ? ?ACTIVITY:  You should plan to take it easy for the  rest of today and you should NOT DRIVE or use heavy machinery until tomorrow (because of the sedation medicines used during the test).   ? ?FOLLOW UP: ?Our staff will call the number listed on your records 48-72 hours following your procedure to check on you and address any questions or concerns that you may have regarding the information given to you following your procedure. If we do not reach you, we will leave a message.  We will attempt to reach you two times.  During this call, we will ask if you have developed any symptoms of COVID 19. If you develop any symptoms (ie: fever, flu-like symptoms, shortness of breath, cough etc.) before then, please call [phone].  If you test positive for Covid 19 in the 2 weeks post procedure, please call and report this information to [place].   ? ?If any biopsies were taken you will be contacted by phone or by letter within the next 1-3 weeks.  Please call us at [phone] if you have not heard about the biopsies in 3 weeks.  ? ? ?SIGNATURES/CONFIDENTIALITY: ?You and/or your care partner have signed paperwork which will be entered into your electronic medical record.  These signatures attest to the fact that that the information above on your After Visit Summary has been reviewed and is understood.  Full responsibility of the confidentiality of this discharge information lies with you and/or your care-partner.  ?

## 2021-11-25 NOTE — Progress Notes (Signed)
Pt's states no medical or surgical changes since previsit or office visit. 

## 2021-11-25 NOTE — Op Note (Signed)
[place] ?Patient Name: Carrie Greer ?Procedure Date: 11/25/2021 3:25 PM ?MRN: [id number] ?Endoscopist: Estill Cotta. Loletha Carrow , MD ?Age: 51 ?Referring MD:  ?Date of Birth: 1970-09-05 ?Gender: Female ?Account #: [id number] ?Procedure:                Colonoscopy ?Indications:              Screening for colorectal malignant neoplasm, This  ?                          is the patient's first colonoscopy ?Medicines:                Monitored Anesthesia Care ?Procedure:                Pre-Anesthesia Assessment: ?                          - Prior to the procedure, a History and Physical  ?                          was performed, and patient medications and  ?                          allergies were reviewed. The patient's tolerance of  ?                          previous anesthesia was also reviewed. The risks  ?                          and benefits of the procedure and the sedation  ?                          options and risks were discussed with the patient.  ?                          All questions were answered, and informed consent  ?                          was obtained. Prior Anticoagulants: The patient has  ?                          taken no previous anticoagulant or antiplatelet  ?                          agents. ASA Grade Assessment: II - A patient with  ?                          mild systemic disease. After reviewing the risks  ?                          and benefits, the patient was deemed in  ?                          satisfactory condition to undergo the procedure. ?  After obtaining informed consent, the colonoscope  ?                          was passed under direct vision. Throughout the  ?                          procedure, the patient's blood pressure, pulse, and  ?                          oxygen saturations were monitored continuously. The  ?                          Olympus [id number] was introduced through the anus  ?                          and advanced to the the  cecum, identified by  ?                          appendiceal orifice and ileocecal valve. The  ?                          colonoscopy was performed without difficulty. The  ?                          patient tolerated the procedure well. The quality  ?                          of the bowel preparation was excellent. The  ?                          ileocecal valve, appendiceal orifice, and rectum  ?                          were photographed. ?Scope In: 3:39:04 PM ?Scope Out: 3:51:17 PM ?Scope Withdrawal Time: 0 hours 9 minutes 12 seconds  ?Total Procedure Duration: 0 hours 12 minutes 13 seconds  ?Findings:                 The perianal and digital rectal examinations were  ?                          normal. ?                          A few diverticula were found in the right colon.  ?                          (ascending and cecum) ?                          Repeat examination of right colon under NBI  ?                          performed. ?  Retroflexion in the rectum was not performed due to  ?                          narrow anatomy. ?                          The exam was otherwise without abnormality. ?Complications:            No immediate complications. ?Estimated Blood Loss:     Estimated blood loss: none. ?Impression:               - Diverticulosis in the right colon. ?                          - The examination was otherwise normal. ?                          - No specimens collected. ?Recommendation:           - Patient has a contact number available for  ?                          emergencies. The signs and symptoms of potential  ?                          delayed complications were discussed with the  ?                          patient. Return to normal activities tomorrow.  ?                          Written discharge instructions were provided to the  ?                          patient. ?                          - Resume previous diet. ?                          - Continue  present medications. ?                          - Repeat colonoscopy in 10 years for screening  ?                          purposes. ?Tallon Gertz L. Loletha Carrow, MD ?11/25/2021 3:55:26 PM ?This report has been signed electronically. ?

## 2021-11-25 NOTE — Progress Notes (Signed)
History and Physical: ? This patient presents for endoscopic testing for: ?Encounter Diagnosis  ?Name Primary?  ? Special screening for malignant neoplasms, colon Yes  ? ? ?Patient denies chronic abdominal pain, rectal bleeding, constipation or diarrhea. ?Average risk screening exam ? ?Patient is otherwise without complaints or active issues today. ? ? ?Past Medical History: ?Past Medical History:  ?Diagnosis Date  ? Gestational diabetes 2009  ? Hypertension   ? ? ? ?Past Surgical History: ?Past Surgical History:  ?Procedure Laterality Date  ? CESAREAN SECTION  2009  ? triplets  ? TUBAL LIGATION  2009  ? ? ?Allergies: ?No Known Allergies ? ?Outpatient Meds: ?Current Outpatient Medications  ?Medication Sig Dispense Refill  ? amLODipine (NORVASC) 5 MG tablet Take 0.5 tablets (2.5 mg total) by mouth daily. 45 tablet 2  ? hydrOXYzine (ATARAX) 10 MG tablet Take 1 tablet (10 mg total) by mouth 3 (three) times daily as needed for anxiety (or sleep). 30 tablet 0  ? lisinopril-hydrochlorothiazide (ZESTORETIC) 20-25 MG tablet TAKE 1 TABLET BY MOUTH ONCE DAILY 90 tablet 2  ? ondansetron (ZOFRAN) 4 MG tablet Take 1 tablet (4 mg total) by mouth as directed. Take one Zofran 4 mg tablet 30-60 minutes before each prep dose 2 tablet 0  ? ?Current Facility-Administered Medications  ?Medication Dose Route Frequency Provider Last Rate Last Admin  ? 0.9 %  sodium chloride infusion  500 mL Intravenous Once Doran Stabler, MD      ? ? ? ? ?___________________________________________________________________ ?Objective  ? ?Exam: ? ?BP 126/78   Pulse 95   Temp (!) 97.5 ?F (36.4 ?C) (Temporal)   Ht '5\' 5"'$  (1.651 m)   Wt 174 lb (78.9 kg)   LMP 04/30/2018 (Exact Date)   SpO2 100%   BMI 28.96 kg/m?  ? ?CV: RRR without murmur, S1/S2 ?Resp: clear to auscultation bilaterally, normal RR and effort noted ?GI: soft, no tenderness, with active bowel sounds. ? ? ?Assessment: ?Encounter Diagnosis  ?Name Primary?  ? Special screening for  malignant neoplasms, colon Yes  ? ? ? ?Plan: ?Colonoscopy ? The benefits and risks of the planned procedure were described in detail with the patient or (when appropriate) their health care proxy.  Risks were outlined as including, but not limited to, bleeding, infection, perforation, adverse medication reaction leading to cardiac or pulmonary decompensation, pancreatitis (if ERCP).  The limitation of incomplete mucosal visualization was also discussed.  No guarantees or warranties were given. ? ? ? ?The patient is appropriate for an endoscopic procedure in the ambulatory setting. ? ? - Wilfrid Lund, MD ? ? ? ? ?

## 2021-11-25 NOTE — Progress Notes (Signed)
PT taken to PACU. Monitors in place. VSS. Report given to RN. 

## 2021-11-29 ENCOUNTER — Telehealth: Payer: Self-pay | Admitting: *Deleted

## 2021-11-29 ENCOUNTER — Telehealth: Payer: Self-pay

## 2021-11-29 NOTE — Telephone Encounter (Signed)
?  Follow up Call- ? ? ?  11/25/2021  ?  2:56 PM  ?Call back number  ?Post procedure Call Back phone  # [phone]  ?Permission to leave phone message Yes  ?  ? ?Patient questions: ? ?Do you have a fever, pain , or abdominal swelling? No. ?Pain Score  0 * ? ?Have you tolerated food without any problems? Yes.   ? ?Have you been able to return to your normal activities? Yes.   ? ?Do you have any questions about your discharge instructions: ?Diet   No. ?Medications  No. ?Follow up visit  No. ? ?Do you have questions or concerns about your Care? No. ? ?Actions: ?* If pain score is 4 or above: ?No action needed, pain <4. ? ? ?

## 2021-11-29 NOTE — Telephone Encounter (Signed)
Left message on f/u call 

## 2021-12-05 ENCOUNTER — Ambulatory Visit: Payer: BC Managed Care – PPO | Admitting: Family Medicine

## 2021-12-19 ENCOUNTER — Ambulatory Visit: Payer: BC Managed Care – PPO | Admitting: Family Medicine

## 2021-12-19 VITALS — BP 136/74 | HR 90 | Temp 98.1°F | Resp 16 | Ht 65.0 in | Wt 170.2 lb

## 2021-12-19 DIAGNOSIS — F5104 Psychophysiologic insomnia: Secondary | ICD-10-CM

## 2021-12-19 DIAGNOSIS — R7303 Prediabetes: Secondary | ICD-10-CM

## 2021-12-19 DIAGNOSIS — I10 Essential (primary) hypertension: Secondary | ICD-10-CM | POA: Diagnosis not present

## 2021-12-19 DIAGNOSIS — F4321 Adjustment disorder with depressed mood: Secondary | ICD-10-CM | POA: Diagnosis not present

## 2021-12-19 MED ORDER — AMLODIPINE BESYLATE 5 MG PO TABS: 2.5000 mg | ORAL_TABLET | Freq: Every day | ORAL | 2 refills | Status: DC

## 2021-12-19 MED ORDER — LISINOPRIL-HYDROCHLOROTHIAZIDE 20-25 MG PO TABS: ORAL_TABLET | ORAL | 2 refills | Status: DC

## 2021-12-19 NOTE — Patient Instructions (Addendum)
Great work with the weight loss. Glad you are doing well. No med changes for now.   Fasting labs at [facility]: [facility] in 8:30-4:30 during weekdays, no appointment needed [place].  [place], [place] [address]   Managing Your Hypertension Hypertension, also called high blood pressure, is when the force of the blood pressing against the walls of the arteries is too strong. Arteries are blood vessels that carry blood from your heart throughout your body. Hypertension forces the heart to work harder to pump blood and may cause the arteries to become narrow or stiff. Understanding blood pressure readings A blood pressure reading includes a higher number over a lower number: The first, or top, number is called the systolic pressure. It is a measure of the pressure in your arteries as your heart beats. The second, or bottom number, is called the diastolic pressure. It is a measure of the pressure in your arteries as the heart relaxes. For most people, a normal blood pressure is below 120/80. Your personal target blood pressure may vary depending on your medical conditions, your age, and other factors. Blood pressure is classified into four stages. Based on your blood pressure reading, your health care provider may use the following stages to determine what type of treatment you need, if any. Systolic pressure and diastolic pressure are measured in a unit called millimeters of mercury (mmHg). Normal Systolic pressure: below 993. Diastolic pressure: below 80. Elevated Systolic pressure: 716-967. Diastolic pressure: below 80. Hypertension stage 1 Systolic pressure: 893-810. Diastolic pressure: 17-51. Hypertension stage 2 Systolic pressure: 025 or above. Diastolic pressure: 90 or above. How can this condition affect me? Managing your hypertension is very important. Over time, hypertension can damage the arteries and decrease blood flow to parts of the body, including the brain,  heart, and kidneys. Having untreated or uncontrolled hypertension can lead to: A heart attack. A stroke. A weakened blood vessel (aneurysm). Heart failure. Kidney damage. Eye damage. Memory and concentration problems. Vascular dementia. What actions can I take to manage this condition? Hypertension can be managed by making lifestyle changes and possibly by taking medicines. Your health care provider will help you make a plan to bring your blood pressure within a normal range. You may be referred for counseling on a healthy diet and physical activity. Nutrition  Eat a diet that is high in fiber and potassium, and low in salt (sodium), added sugar, and fat. An example eating plan is called the DASH diet. DASH stands for Dietary Approaches to Stop Hypertension. To eat this way: Eat plenty of fresh fruits and vegetables. Try to fill one-half of your plate at each meal with fruits and vegetables. Eat whole grains, such as whole-wheat pasta, brown rice, or whole-grain bread. Fill about one-fourth of your plate with whole grains. Eat low-fat dairy products. Avoid fatty cuts of meat, processed or cured meats, and poultry with skin. Fill about one-fourth of your plate with lean proteins such as fish, chicken without skin, beans, eggs, and tofu. Avoid pre-made and processed foods. These tend to be higher in sodium, added sugar, and fat. Reduce your daily sodium intake. Many people with hypertension should eat less than 1,500 mg of sodium a day. Lifestyle  Work with your health care provider to maintain a healthy body weight or to lose weight. Ask what an ideal weight is for you. Get at least 30 minutes of exercise that causes your heart to beat faster (aerobic exercise) most days of the  week. Activities may include walking, swimming, or biking. Include exercise to strengthen your muscles (resistance exercise), such as weight lifting, as part of your weekly exercise routine. Try to do these types of  exercises for 30 minutes at least 3 days a week. Do not use any products that contain nicotine or tobacco. These products include cigarettes, chewing tobacco, and vaping devices, such as e-cigarettes. If you need help quitting, ask your health care provider. Control any long-term (chronic) conditions you have, such as high cholesterol or diabetes. Identify your sources of stress and find ways to manage stress. This may include meditation, deep breathing, or making time for fun activities. Alcohol use Do not drink alcohol if: Your health care provider tells you not to drink. You are pregnant, may be pregnant, or are planning to become pregnant. If you drink alcohol: Limit how much you have to: 0-1 drink a day for women. 0-2 drinks a day for men. Know how much alcohol is in your drink. In the [place].S., one drink equals one 12 oz bottle of beer (355 mL), one 5 oz glass of wine (148 mL), or one 1 oz glass of hard liquor (44 mL). Medicines Your health care provider may prescribe medicine if lifestyle changes are not enough to get your blood pressure under control and if: Your systolic blood pressure is 130 or higher. Your diastolic blood pressure is 80 or higher. Take medicines only as told by your health care provider. Follow the directions carefully. Blood pressure medicines must be taken as told by your health care provider. The medicine does not work as well when you skip doses. Skipping doses also puts you at risk for problems. Monitoring Before you monitor your blood pressure: Do not smoke, drink caffeinated beverages, or exercise within 30 minutes before taking a measurement. Use the bathroom and empty your bladder (urinate). Sit quietly for at least 5 minutes before taking measurements. Monitor your blood pressure at home as told by your health care provider. To do this: Sit with your back straight and supported. Place your feet flat on the floor. Do not cross your legs. Support your arm on  a flat surface, such as a table. Make sure your upper arm is at heart level. Each time you measure, take two or three readings one minute apart and record the results. You may also need to have your blood pressure checked regularly by your health care provider. General information Talk with your health care provider about your diet, exercise habits, and other lifestyle factors that may be contributing to hypertension. Review all the medicines you take with your health care provider because there may be side effects or interactions. Keep all follow-up visits. Your health care provider can help you create and adjust your plan for managing your high blood pressure. Where to find more information National Heart, Lung, and Blood Institute: https://wilson-eaton.com/ American Heart Association: www.heart.org Contact a health care provider if: You think you are having a reaction to medicines you have taken. You have repeated (recurrent) headaches. You feel dizzy. You have swelling in your ankles. You have trouble with your vision. Get help right away if: You develop a severe headache or confusion. You have unusual weakness or numbness, or you feel faint. You have severe pain in your chest or abdomen. You vomit repeatedly. You have trouble breathing. These symptoms may be an emergency. Get help right away. Call 911. Do not wait to see if the symptoms will go away. Do not drive yourself to  the hospital. Summary Hypertension is when the force of blood pumping through your arteries is too strong. If this condition is not controlled, it may put you at risk for serious complications. Your personal target blood pressure may vary depending on your medical conditions, your age, and other factors. For most people, a normal blood pressure is less than 120/80. Hypertension is managed by lifestyle changes, medicines, or both. Lifestyle changes to help manage hypertension include losing weight, eating a healthy,  low-sodium diet, exercising more, stopping smoking, and limiting alcohol. This information is not intended to replace advice given to you by your health care provider. Make sure you discuss any questions you have with your health care provider. Document Revised: 03/31/2021 Document Reviewed: 03/31/2021 Elsevier Patient Education  [place].

## 2021-12-19 NOTE — Progress Notes (Signed)
Subjective:  Patient ID: Carrie Greer, female    DOB: February 09, 1971  Age: 51 y.o. MRN: [id number]  CC:  Chief Complaint  Patient presents with   Prediabetes    Pt doing okay no questions    Hypertension    Pt here for recheck no concerns     HPI [name] presents for   Hypertension: Amlodipine 2.'5mg'$  qd, lisinopril hct 20/'25mg'$  qd Home readings: 1301-131/70's. BP Readings from Last 3 Encounters:  12/19/21 136/74  11/25/21 (!) 128/98  10/26/21 132/76   Lab Results  Component Value Date   CREATININE 1.01 06/27/2021    Prediabetes: Losing weight with working out, toning.  Lab Results  Component Value Date   HGBA1C 6.5 06/27/2021   Wt Readings from Last 3 Encounters:  12/19/21 170 lb 3.2 oz (77.2 kg)  11/25/21 174 lb (78.9 kg)  11/11/21 174 lb (78.9 kg)   Sleep doing well - not needing hydroxyzine. Doing better. Mother's day was difficulty but did ok. Support system in place.   History Patient Active Problem List   Diagnosis Date Noted   Essential hypertension 06/24/2014   Past Medical History:  Diagnosis Date   Gestational diabetes 2009   Hypertension    Past Surgical History:  Procedure Laterality Date   CESAREAN SECTION  2009   triplets   TUBAL LIGATION  2009   No Known Allergies Prior to Admission medications   Medication Sig Start Date End Date Taking? Authorizing Provider  amLODipine (NORVASC) 5 MG tablet Take 0.5 tablets (2.5 mg total) by mouth daily. 06/27/21  Yes Wendie Agreste, MD  lisinopril-hydrochlorothiazide (ZESTORETIC) 20-25 MG tablet TAKE 1 TABLET BY MOUTH ONCE DAILY 06/27/21  Yes Wendie Agreste, MD   Social History   Socioeconomic History   Marital status: Married    Spouse name: Darcus Austin   Number of children: 7   Years of education: college   Highest education level: Not on file  Occupational History   Occupation: Childcare    Comment: Teacher, early years/pre  Tobacco Use   Smoking status: Never    Smokeless tobacco: Never  Vaping Use   Vaping Use: Never used  Substance and Sexual Activity   Alcohol use: Yes    Alcohol/week: 0.0 - 3.0 standard drinks    Comment: wine on the weekends, reports not weekly   Drug use: Never   Sexual activity: Yes    Partners: Male    Birth control/protection: Surgical, Post-menopausal    Comment: BTL 2009 after the birth of triplets, sexually active with monogamous partner  Other Topics Concern   Not on file  Social History Narrative   Pt is from [place]. Has been in [place] since 2005.   Just graduated in 11/2016 at [employer] with degree in Early Childhood Development.Continuing on now for BA at [employer].   Lives with her husband and their 4 youngest children (triplets and a 57 year old). Has 3 other grown children. (7 children total).       Exercise includes walking, stair climbing, playing with the children.       Diet includes baked chicken, fish, potatoes, salads and greens. Likes water and minute maid OJ. Gets enough dairy through milk, cheese, and yogurt.       Social Determinants of Health   Financial Resource Strain: Not on file  Food Insecurity: Not on file  Transportation Needs: Not on file  Physical Activity: Not on file  Stress: Not on file  Social Connections:  Not on file  Intimate Partner Violence: Not on file    Review of Systems  Constitutional:  Negative for fatigue and unexpected weight change.  Respiratory:  Negative for chest tightness and shortness of breath.   Cardiovascular:  Negative for chest pain, palpitations and leg swelling.  Gastrointestinal:  Negative for abdominal pain and blood in stool.  Neurological:  Negative for dizziness, syncope, light-headedness and headaches.    Objective:   Vitals:   12/19/21 1541  BP: 136/74  Pulse: 90  Resp: 16  Temp: 98.1 F (36.7 C)  TempSrc: Temporal  SpO2: 99%  Weight: 170 lb 3.2 oz (77.2 kg)  Height: '5\' 5"'$  (1.651 m)     Physical Exam Vitals reviewed.   Constitutional:      Appearance: Normal appearance. She is well-developed.  HENT:     Head: Normocephalic and atraumatic.  Eyes:     Conjunctiva/sclera: Conjunctivae normal.     Pupils: Pupils are equal, round, and reactive to light.  Neck:     Vascular: No carotid bruit.  Cardiovascular:     Rate and Rhythm: Normal rate and regular rhythm.     Heart sounds: Normal heart sounds.  Pulmonary:     Effort: Pulmonary effort is normal.     Breath sounds: Normal breath sounds.  Abdominal:     Palpations: Abdomen is soft. There is no pulsatile mass.     Tenderness: There is no abdominal tenderness.  Musculoskeletal:     Right lower leg: No edema.     Left lower leg: No edema.  Skin:    General: Skin is warm and dry.  Neurological:     Mental Status: She is alert and oriented to person, place, and time.  Psychiatric:        Mood and Affect: Mood normal.        Behavior: Behavior normal.       Assessment & Plan:  Carrie Greer is a 51 y.o. female . Prediabetes - Plan: Hemoglobin A1c   -Plan for fasting labs but commended on weight loss with exercise.  Anticipate A1c will be improved.  Essential hypertension - Plan: amLODipine (NORVASC) 5 MG tablet, lisinopril-hydrochlorothiazide (ZESTORETIC) 20-25 MG tablet, Comprehensive metabolic panel, Lipid panel  -Borderline but overall stable and anticipate improvement with weight loss, exercise.  Continue Zestoretic, amlodipine same doses for now.  Fasting labs ordered.  Grieving Psychophysiological insomnia  -Doing much better and not needing hydroxyzine at this time.  Has support system in place.  RTC precautions.  Meds ordered this encounter  Medications   amLODipine (NORVASC) 5 MG tablet    Sig: Take 0.5 tablets (2.5 mg total) by mouth daily.    Dispense:  45 tablet    Refill:  2   lisinopril-hydrochlorothiazide (ZESTORETIC) 20-25 MG tablet    Sig: TAKE 1 TABLET BY MOUTH ONCE DAILY    Dispense:  90 tablet    Refill:  2    Patient Instructions  Great work with the weight loss. Glad you are doing well. No med changes for now.   Fasting labs at [facility]: [facility] in 8:30-4:30 during weekdays, no appointment needed [place].  [place], [place] [address]   Managing Your Hypertension Hypertension, also called high blood pressure, is when the force of the blood pressing against the walls of the arteries is too strong. Arteries are blood vessels that carry blood from your heart throughout your body. Hypertension forces the heart to work harder to  pump blood and may cause the arteries to become narrow or stiff. Understanding blood pressure readings A blood pressure reading includes a higher number over a lower number: The first, or top, number is called the systolic pressure. It is a measure of the pressure in your arteries as your heart beats. The second, or bottom number, is called the diastolic pressure. It is a measure of the pressure in your arteries as the heart relaxes. For most people, a normal blood pressure is below 120/80. Your personal target blood pressure may vary depending on your medical conditions, your age, and other factors. Blood pressure is classified into four stages. Based on your blood pressure reading, your health care provider may use the following stages to determine what type of treatment you need, if any. Systolic pressure and diastolic pressure are measured in a unit called millimeters of mercury (mmHg). Normal Systolic pressure: below 440. Diastolic pressure: below 80. Elevated Systolic pressure: 347-425. Diastolic pressure: below 80. Hypertension stage 1 Systolic pressure: 956-387. Diastolic pressure: 56-43. Hypertension stage 2 Systolic pressure: 329 or above. Diastolic pressure: 90 or above. How can this condition affect me? Managing your hypertension is very important. Over time, hypertension can damage the arteries and decrease blood flow to parts of the  body, including the brain, heart, and kidneys. Having untreated or uncontrolled hypertension can lead to: A heart attack. A stroke. A weakened blood vessel (aneurysm). Heart failure. Kidney damage. Eye damage. Memory and concentration problems. Vascular dementia. What actions can I take to manage this condition? Hypertension can be managed by making lifestyle changes and possibly by taking medicines. Your health care provider will help you make a plan to bring your blood pressure within a normal range. You may be referred for counseling on a healthy diet and physical activity. Nutrition  Eat a diet that is high in fiber and potassium, and low in salt (sodium), added sugar, and fat. An example eating plan is called the DASH diet. DASH stands for Dietary Approaches to Stop Hypertension. To eat this way: Eat plenty of fresh fruits and vegetables. Try to fill one-half of your plate at each meal with fruits and vegetables. Eat whole grains, such as whole-wheat pasta, brown rice, or whole-grain bread. Fill about one-fourth of your plate with whole grains. Eat low-fat dairy products. Avoid fatty cuts of meat, processed or cured meats, and poultry with skin. Fill about one-fourth of your plate with lean proteins such as fish, chicken without skin, beans, eggs, and tofu. Avoid pre-made and processed foods. These tend to be higher in sodium, added sugar, and fat. Reduce your daily sodium intake. Many people with hypertension should eat less than 1,500 mg of sodium a day. Lifestyle  Work with your health care provider to maintain a healthy body weight or to lose weight. Ask what an ideal weight is for you. Get at least 30 minutes of exercise that causes your heart to beat faster (aerobic exercise) most days of the week. Activities may include walking, swimming, or biking. Include exercise to strengthen your muscles (resistance exercise), such as weight lifting, as part of your weekly exercise routine.  Try to do these types of exercises for 30 minutes at least 3 days a week. Do not use any products that contain nicotine or tobacco. These products include cigarettes, chewing tobacco, and vaping devices, such as e-cigarettes. If you need help quitting, ask your health care provider. Control any long-term (chronic) conditions you have, such as high cholesterol or diabetes. Identify your  sources of stress and find ways to manage stress. This may include meditation, deep breathing, or making time for fun activities. Alcohol use Do not drink alcohol if: Your health care provider tells you not to drink. You are pregnant, may be pregnant, or are planning to become pregnant. If you drink alcohol: Limit how much you have to: 0-1 drink a day for women. 0-2 drinks a day for men. Know how much alcohol is in your drink. In the U.S., one drink equals one 12 oz bottle of beer (355 mL), one 5 oz glass of wine (148 mL), or one 1 oz glass of hard liquor (44 mL). Medicines Your health care provider may prescribe medicine if lifestyle changes are not enough to get your blood pressure under control and if: Your systolic blood pressure is 130 or higher. Your diastolic blood pressure is 80 or higher. Take medicines only as told by your health care provider. Follow the directions carefully. Blood pressure medicines must be taken as told by your health care provider. The medicine does not work as well when you skip doses. Skipping doses also puts you at risk for problems. Monitoring Before you monitor your blood pressure: Do not smoke, drink caffeinated beverages, or exercise within 30 minutes before taking a measurement. Use the bathroom and empty your bladder (urinate). Sit quietly for at least 5 minutes before taking measurements. Monitor your blood pressure at home as told by your health care provider. To do this: Sit with your back straight and supported. Place your feet flat on the floor. Do not cross your  legs. Support your arm on a flat surface, such as a table. Make sure your upper arm is at heart level. Each time you measure, take two or three readings one minute apart and record the results. You may also need to have your blood pressure checked regularly by your health care provider. General information Talk with your health care provider about your diet, exercise habits, and other lifestyle factors that may be contributing to hypertension. Review all the medicines you take with your health care provider because there may be side effects or interactions. Keep all follow-up visits. Your health care provider can help you create and adjust your plan for managing your high blood pressure. Where to find more information National Heart, Lung, and Blood Institute: https://wilson-eaton.com/ American Heart Association: www.heart.org Contact a health care provider if: You think you are having a reaction to medicines you have taken. You have repeated (recurrent) headaches. You feel dizzy. You have swelling in your ankles. You have trouble with your vision. Get help right away if: You develop a severe headache or confusion. You have unusual weakness or numbness, or you feel faint. You have severe pain in your chest or abdomen. You vomit repeatedly. You have trouble breathing. These symptoms may be an emergency. Get help right away. Call 911. Do not wait to see if the symptoms will go away. Do not drive yourself to the hospital. Summary Hypertension is when the force of blood pumping through your arteries is too strong. If this condition is not controlled, it may put you at risk for serious complications. Your personal target blood pressure may vary depending on your medical conditions, your age, and other factors. For most people, a normal blood pressure is less than 120/80. Hypertension is managed by lifestyle changes, medicines, or both. Lifestyle changes to help manage hypertension include losing  weight, eating a healthy, low-sodium diet, exercising more, stopping smoking, and limiting alcohol.  This information is not intended to replace advice given to you by your health care provider. Make sure you discuss any questions you have with your health care provider. Document Revised: 03/31/2021 Document Reviewed: 03/31/2021 Elsevier Patient Education  2023 [place],   Merri Ray, MD [place] 12/19/21 5:19 PM

## 2021-12-31 ENCOUNTER — Emergency Department (HOSPITAL_COMMUNITY): Admission: EM | Admit: 2021-12-31 | Payer: BC Managed Care – PPO | Source: Home / Self Care

## 2021-12-31 NOTE — ED Notes (Signed)
Pt not supposed to be here for outpatient bloodwork, not an ER pt.

## 2022-01-05 ENCOUNTER — Other Ambulatory Visit (INDEPENDENT_AMBULATORY_CARE_PROVIDER_SITE_OTHER): Payer: BC Managed Care – PPO

## 2022-01-05 DIAGNOSIS — I10 Essential (primary) hypertension: Secondary | ICD-10-CM

## 2022-01-05 DIAGNOSIS — R7303 Prediabetes: Secondary | ICD-10-CM

## 2022-01-05 LAB — COMPREHENSIVE METABOLIC PANEL
ALT: 10 U/L (ref 0–35)
AST: 12 U/L (ref 0–37)
Albumin: 4.5 g/dL (ref 3.5–5.2)
Alkaline Phosphatase: 58 U/L (ref 39–117)
BUN: 17 mg/dL (ref 6–23)
CO2: 32 mEq/L (ref 19–32)
Calcium: 10.2 mg/dL (ref 8.4–10.5)
Chloride: 99 mEq/L (ref 96–112)
Creatinine, Ser: 1.07 mg/dL (ref 0.40–1.20)
GFR: 59.74 mL/min — ABNORMAL LOW (ref >60.00)
Glucose, Bld: 134 mg/dL — ABNORMAL HIGH (ref 70–99)
Potassium: 3 mEq/L — ABNORMAL LOW (ref 3.5–5.1)
Sodium: 139 mEq/L (ref 135–145)
Total Bilirubin: 0.7 mg/dL (ref 0.2–1.2)
Total Protein: 7.7 g/dL (ref 6.0–8.3)

## 2022-01-05 LAB — LIPID PANEL
Cholesterol: 232 mg/dL — ABNORMAL HIGH (ref 0–200)
HDL: 57.6 mg/dL (ref >39.00)
LDL Cholesterol: 160 mg/dL — ABNORMAL HIGH (ref 0–99)
NonHDL: 174.33
Total CHOL/HDL Ratio: 4
Triglycerides: 71 mg/dL (ref 0.0–149.0)
VLDL: 14.2 mg/dL (ref 0.0–40.0)

## 2022-01-05 LAB — HEMOGLOBIN A1C: Hgb A1c MFr Bld: 6.5 % (ref 4.6–6.5)

## 2022-01-10 ENCOUNTER — Other Ambulatory Visit: Payer: Self-pay | Admitting: Family Medicine

## 2022-01-10 DIAGNOSIS — E876 Hypokalemia: Secondary | ICD-10-CM

## 2022-01-10 MED ORDER — POTASSIUM CHLORIDE CRYS ER 20 MEQ PO TBCR: 20.0000 meq | EXTENDED_RELEASE_TABLET | Freq: Every day | ORAL | 3 refills | Status: DC

## 2022-01-10 NOTE — Progress Notes (Signed)
Hypokalemia, start supplement. Repeat labs 1 week.

## 2022-01-11 ENCOUNTER — Telehealth: Payer: Self-pay

## 2022-01-11 ENCOUNTER — Telehealth: Payer: Self-pay | Admitting: Family Medicine

## 2022-01-11 NOTE — Telephone Encounter (Signed)
Per the message pt needs 1 week lab only visit, can you please call her back to schedule this?

## 2022-01-11 NOTE — Telephone Encounter (Signed)
-----   Message from Wendie Agreste, MD sent at 01/10/2022  3:13 PM EDT ----- Potassium level was low.  Start potassium supplement, initially 2 pills, then 1/day with recheck labs in the next 1 week.  If any new symptoms be seen right away.  Cholesterol levels were elevated, similar to some readings 6 months ago with minimal improvement.  Other electrolytes overall looked okay.  67-monthblood sugar test still at prediabetes range, borderline diabetes range, but stable.  Should be rechecked in 3 to 6 months at the most.

## 2022-01-11 NOTE — Telephone Encounter (Signed)
Pt called stats she got her lab result but wanting to know what do she need to do next.

## 2022-01-12 ENCOUNTER — Telehealth: Payer: Self-pay | Admitting: Family Medicine

## 2022-01-12 NOTE — Telephone Encounter (Signed)
LM to have pt call back, need to confirm she did read her result note as well as get her scheduled for the lab only visit in one week

## 2022-01-12 NOTE — Telephone Encounter (Signed)
I called pt about scheduling a lab appt. Pt stats that she want to go back to the [facility] on [address] for labs bc its closer. Pt stats she may need a referral.

## 2022-01-20 ENCOUNTER — Other Ambulatory Visit (INDEPENDENT_AMBULATORY_CARE_PROVIDER_SITE_OTHER): Payer: BC Managed Care – PPO

## 2022-01-20 DIAGNOSIS — E876 Hypokalemia: Secondary | ICD-10-CM

## 2022-01-20 LAB — BASIC METABOLIC PANEL
BUN: 15 mg/dL (ref 6–23)
CO2: 30 mEq/L (ref 19–32)
Calcium: 9.9 mg/dL (ref 8.4–10.5)
Chloride: 101 mEq/L (ref 96–112)
Creatinine, Ser: 1 mg/dL (ref 0.40–1.20)
GFR: 64.78 mL/min (ref >60.00)
Glucose, Bld: 131 mg/dL — ABNORMAL HIGH (ref 70–99)
Potassium: 3.5 mEq/L (ref 3.5–5.1)
Sodium: 139 mEq/L (ref 135–145)

## 2022-01-24 ENCOUNTER — Other Ambulatory Visit: Payer: Self-pay | Admitting: Family Medicine

## 2022-01-24 DIAGNOSIS — E876 Hypokalemia: Secondary | ICD-10-CM

## 2022-03-08 ENCOUNTER — Other Ambulatory Visit (INDEPENDENT_AMBULATORY_CARE_PROVIDER_SITE_OTHER): Payer: BC Managed Care – PPO

## 2022-03-08 DIAGNOSIS — E876 Hypokalemia: Secondary | ICD-10-CM | POA: Diagnosis not present

## 2022-03-08 LAB — BASIC METABOLIC PANEL
BUN: 16 mg/dL (ref 6–23)
CO2: 31 mEq/L (ref 19–32)
Calcium: 10.4 mg/dL (ref 8.4–10.5)
Chloride: 100 mEq/L (ref 96–112)
Creatinine, Ser: 1.03 mg/dL (ref 0.40–1.20)
GFR: 62.46 mL/min (ref >60.00)
Glucose, Bld: 119 mg/dL — ABNORMAL HIGH (ref 70–99)
Potassium: 3.5 mEq/L (ref 3.5–5.1)
Sodium: 139 mEq/L (ref 135–145)

## 2022-06-28 ENCOUNTER — Encounter: Payer: BC Managed Care – PPO | Admitting: Family Medicine

## 2022-07-03 ENCOUNTER — Other Ambulatory Visit: Payer: Self-pay

## 2022-07-03 ENCOUNTER — Telehealth: Payer: Self-pay | Admitting: Family Medicine

## 2022-07-03 DIAGNOSIS — I10 Essential (primary) hypertension: Secondary | ICD-10-CM

## 2022-07-03 MED ORDER — AMLODIPINE BESYLATE 5 MG PO TABS: 2.5000 mg | ORAL_TABLET | Freq: Every day | ORAL | 0 refills | Status: DC

## 2022-07-03 MED ORDER — LISINOPRIL-HYDROCHLOROTHIAZIDE 20-25 MG PO TABS: ORAL_TABLET | ORAL | 0 refills | Status: DC

## 2022-07-03 NOTE — Telephone Encounter (Signed)
Sent!

## 2022-07-03 NOTE — Telephone Encounter (Signed)
Encourage patient to contact the pharmacy for refills or they can request refills through [facility]  (Please schedule appointment if patient has not been seen in over a year)    [place] TO:  [place] NAME & DOSE: Amlodipine 5 mg   NOTES/COMMENTS FROM PATIENT: Pt missed physical 06/28/22. Rescheduled for 08/28/22. Pt needs refill on this medication       Front office please notify patient: It takes 48-72 hours to process rx refill requests Ask patient to call pharmacy to ensure rx is ready before heading there.

## 2022-08-28 ENCOUNTER — Other Ambulatory Visit (HOSPITAL_COMMUNITY)
Admission: RE | Admit: 2022-08-28 | Discharge: 2022-08-28 | Disposition: A | Discharge status: Home / Self Care | Payer: 59 | Source: Ambulatory Visit | Attending: Family Medicine | Admitting: Family Medicine

## 2022-08-28 ENCOUNTER — Encounter: Payer: Self-pay | Admitting: Family Medicine

## 2022-08-28 ENCOUNTER — Ambulatory Visit (INDEPENDENT_AMBULATORY_CARE_PROVIDER_SITE_OTHER): Payer: 59 | Admitting: Family Medicine

## 2022-08-28 VITALS — BP 122/80 | HR 93 | Temp 98.5°F | Resp 17 | Ht 65.0 in | Wt 173.0 lb

## 2022-08-28 DIAGNOSIS — I10 Essential (primary) hypertension: Secondary | ICD-10-CM | POA: Diagnosis not present

## 2022-08-28 DIAGNOSIS — Z124 Encounter for screening for malignant neoplasm of cervix: Secondary | ICD-10-CM

## 2022-08-28 DIAGNOSIS — R7303 Prediabetes: Secondary | ICD-10-CM

## 2022-08-28 DIAGNOSIS — Z0000 Encounter for general adult medical examination without abnormal findings: Secondary | ICD-10-CM

## 2022-08-28 DIAGNOSIS — M7918 Myalgia, other site: Secondary | ICD-10-CM

## 2022-08-28 DIAGNOSIS — E876 Hypokalemia: Secondary | ICD-10-CM | POA: Diagnosis not present

## 2022-08-28 DIAGNOSIS — Z23 Encounter for immunization: Secondary | ICD-10-CM

## 2022-08-28 DIAGNOSIS — G8929 Other chronic pain: Secondary | ICD-10-CM

## 2022-08-28 DIAGNOSIS — M25512 Pain in left shoulder: Secondary | ICD-10-CM

## 2022-08-28 MED ORDER — AMLODIPINE BESYLATE 5 MG PO TABS: 2.5000 mg | ORAL_TABLET | Freq: Every day | ORAL | 1 refills | Status: DC

## 2022-08-28 MED ORDER — POTASSIUM CHLORIDE CRYS ER 20 MEQ PO TBCR: 20.0000 meq | EXTENDED_RELEASE_TABLET | Freq: Every day | ORAL | 1 refills | Status: DC

## 2022-08-28 MED ORDER — LISINOPRIL-HYDROCHLOROTHIAZIDE 20-25 MG PO TABS: ORAL_TABLET | ORAL | 1 refills | Status: DC

## 2022-08-28 MED ORDER — MELOXICAM 7.5 MG PO TABS: 7.5000 mg | ORAL_TABLET | Freq: Every day | ORAL | 0 refills | Status: DC

## 2022-08-28 NOTE — Patient Instructions (Addendum)
Try slowly incorporate more fiber in diet or fiber supplement like metamucil. Follow up if soft stools not improving or worsening.  Blood pressure looks good.  I will check labs today, but restart potassium daily.  Cut back on sweet tea. Water is best.  Please have x-ray performed at the [facility] location located below.  Meloxicam 1 pill/day temporarily may help with that shoulder pain, but as we discussed some of that could be from repetitive lifting.  Follow-up in the next few weeks and we can discuss treatment options further including possible orthopedic eval or physical therapy if that pain is not improving.  Thank you for coming today and take care.  [place] [facility] for xray Walk in 8:30-4:30 during weekdays, no appointment needed [place].  [place], [place] [address]    Preventive Care 54-54 Years Old, Female Preventive care refers to lifestyle choices and visits with your health care provider that can promote health and wellness. Preventive care visits are also called wellness exams. What can I expect for my preventive care visit? Counseling Your health care provider may ask you questions about your: Medical history, including: Past medical problems. Family medical history. Pregnancy history. Current health, including: Menstrual cycle. Method of birth control. Emotional well-being. Home life and relationship well-being. Sexual activity and sexual health. Lifestyle, including: Alcohol, nicotine or tobacco, and drug use. Access to firearms. Diet, exercise, and sleep habits. Work and work Statistician. Sunscreen use. Safety issues such as seatbelt and bike helmet use. Physical exam Your health care provider will check your: Height and weight. These may be used to calculate your BMI (body mass index). BMI is a measurement that tells if you are at a healthy weight. Waist circumference. This measures the distance around your waistline. This measurement also tells if you are  at a healthy weight and may help predict your risk of certain diseases, such as type 2 diabetes and high blood pressure. Heart rate and blood pressure. Body temperature. Skin for abnormal spots. What immunizations do I need?  Vaccines are usually given at various ages, according to a schedule. Your health care provider will recommend vaccines for you based on your age, medical history, and lifestyle or other factors, such as travel or where you work. What tests do I need? Screening Your health care provider may recommend screening tests for certain conditions. This may include: Lipid and cholesterol levels. Diabetes screening. This is done by checking your blood sugar (glucose) after you have not eaten for a while (fasting). Pelvic exam and Pap test. Hepatitis B test. Hepatitis C test. HIV (human immunodeficiency virus) test. STI (sexually transmitted infection) testing, if you are at risk. Lung cancer screening. Colorectal cancer screening. Mammogram. Talk with your health care provider about when you should start having regular mammograms. This may depend on whether you have a family history of breast cancer. BRCA-related cancer screening. This may be done if you have a family history of breast, ovarian, tubal, or peritoneal cancers. Bone density scan. This is done to screen for osteoporosis. Talk with your health care provider about your test results, treatment options, and if necessary, the need for more tests. Follow these instructions at home: Eating and drinking  Eat a diet that includes fresh fruits and vegetables, whole grains, lean protein, and low-fat dairy products. Take vitamin and mineral supplements as recommended by your health care provider. Do not drink alcohol if: Your health care provider tells you not to drink. You are pregnant, may be pregnant, or  are planning to become pregnant. If you drink alcohol: Limit how much you have to 0-1 drink a day. Know how much  alcohol is in your drink. In the [place] one drink equals one 12 oz bottle of beer (355 mL), one 5 oz glass of wine (148 mL), or one 1 oz glass of hard liquor (44 mL). Lifestyle Brush your teeth every morning and night with fluoride toothpaste. Floss one time each day. Exercise for at least 30 minutes 5 or more days each week. Do not use any products that contain nicotine or tobacco. These products include cigarettes, chewing tobacco, and vaping devices, such as e-cigarettes. If you need help quitting, ask your health care provider. Do not use drugs. If you are sexually active, practice safe sex. Use a condom or other form of protection to prevent STIs. If you do not wish to become pregnant, use a form of birth control. If you plan to become pregnant, see your health care provider for a prepregnancy visit. Take aspirin only as told by your health care provider. Make sure that you understand how much to take and what form to take. Work with your health care provider to find out whether it is safe and beneficial for you to take aspirin daily. Find healthy ways to manage stress, such as: Meditation, yoga, or listening to music. Journaling. Talking to a trusted person. Spending time with friends and family. Minimize exposure to UV radiation to reduce your risk of skin cancer. Safety Always wear your seat belt while driving or riding in a vehicle. Do not drive: If you have been drinking alcohol. Do not ride with someone who has been drinking. When you are tired or distracted. While texting. If you have been using any mind-altering substances or drugs. Wear a helmet and other protective equipment during sports activities. If you have firearms in your house, make sure you follow all gun safety procedures. Seek help if you have been physically or sexually abused. What's next? Visit your health care provider once a year for an annual wellness visit. Ask your health care provider how often you should  have your eyes and teeth checked. Stay up to date on all vaccines. This information is not intended to replace advice given to you by your health care provider. Make sure you discuss any questions you have with your health care provider. Document Revised: 01/12/2021 Document Reviewed: 01/12/2021 Elsevier Patient Education  [employer] [place].  Shoulder Pain Many things can cause shoulder pain, including: An injury to the shoulder. Overuse of the shoulder. Arthritis. The source of the pain can be: Inflammation. An injury to the shoulder joint. An injury to a tendon, ligament, or bone. Follow these instructions at home: Pay attention to changes in your symptoms. Let your health care provider know about them. Follow these instructions to relieve your pain. If you have a sling: Wear the sling as told by your health care provider. Remove it only as told by your health care provider. Loosen the sling if your fingers tingle, become numb, or turn cold and blue. Keep the sling clean. If the sling is not waterproof: Do not let it get wet. Remove it to shower or bathe. Move your arm as little as possible, but keep your hand moving to prevent swelling. Managing pain, stiffness, and swelling  If directed, put ice on the painful area: Put ice in a plastic bag. Place a towel between your skin and the bag. Leave the ice on for 20 minutes,  2-3 times per day. Stop applying ice if it does not help with the pain. Squeeze a soft ball or a foam pad as much as possible. This helps to keep the shoulder from swelling. It also helps to strengthen the arm. General instructions Take over-the-counter and prescription medicines only as told by your health care provider. Keep all follow-up visits as told by your health care provider. This is important. Contact a health care provider if: Your pain gets worse. Your pain is not relieved with medicines. New pain develops in your arm, hand, or fingers. Get help  right away if: Your arm, hand, or fingers: Tingle. Become numb. Become swollen. Become painful. Turn white or blue. Summary Shoulder pain can be caused by an injury, overuse, or arthritis. Pay attention to changes in your symptoms. Let your health care provider know about them. This condition may be treated with a sling, ice, and pain medicines. Contact your health care provider if the pain gets worse or new pain develops. Get help right away if your arm, hand, or fingers tingle or become numb, swollen, or painful. Keep all follow-up visits as told by your health care provider. This is important. This information is not intended to replace advice given to you by your health care provider. Make sure you discuss any questions you have with your health care provider. Document Revised: 04/01/2021 Document Reviewed: 04/01/2021 Elsevier Patient Education  [place].

## 2022-08-28 NOTE — Progress Notes (Unsigned)
Subjective:  Patient ID: Carrie Greer, female    DOB: 07-19-1971  Age: 52 y.o. MRN: [id number]  CC:  Chief Complaint  Patient presents with   Annual Exam    Annual exam  Left arm hurts in shoulder Denies any injury  Feels sore     HPI [name] presents for Annual Exam and other concern above.   Hypertension: Treated with amlodipine 2.5 mg daily, lisinopril HCTZ 20/25 mg QD, Potassium 20 mg daily, until ran out few weeks ago. No no heart palpitations.  Compliant with meds, no missed doses. No new side effects Soft BM in am. Plans to increase fiber in diet.  Home readings:    BP Readings from Last 3 Encounters:  08/28/22 122/80  12/19/21 136/74  11/25/21 (!) 128/98   Lab Results  Component Value Date   CREATININE 1.03 03/08/2022   Prediabetes: With most recent A1c at diabetic level in June of last year.  Diet/exercise approach, no prescription meds for blood sugar at this time. No new diet/exercise changes.  Sweet tea once per day.   Lab Results  Component Value Date   HGBA1C 6.5 01/05/2022   Wt Readings from Last 3 Encounters:  08/28/22 173 lb (78.5 kg)  12/19/21 170 lb 3.2 oz (77.2 kg)  11/25/21 174 lb (78.9 kg)   Left shoulder pain For years. Treated with flexeril in 2019 and exercises - no relief.  Pain off and on since that time.  Sore to lay on left arm at night. Cold weather flares.  No injury. Surgery injection.  Left hand dominant.  No weakness, just ache at times.  No limitations, or decreased ROM, just sore.  Tx: heating pad - helps soreness.  No hx of PUD or CKD.       08/28/2022    3:11 PM 06/27/2021    2:10 PM 02/12/2019   11:40 AM 05/13/2018    3:24 PM 12/27/2017    4:38 PM  Depression screen PHQ 2/9  Decreased Interest 0 0 0 0 0  Down, Depressed, Hopeless 0 0 0 0 0  PHQ - 2 Score 0 0 0 0 0  Altered sleeping 0 0     Tired, decreased energy 0 0     Change in appetite 0 0     Feeling bad or failure about yourself  0  0     Trouble concentrating 0 0     Moving slowly or fidgety/restless 0 0     Suicidal thoughts 0 0     PHQ-9 Score 0 0     Difficult doing work/chores Not difficult at all        Health Maintenance  Topic Date Due   DTaP/Tdap/Td (2 - Td or Tdap) 12/29/2017   PAP SMEAR-Modifier  02/11/2022   INFLUENZA VACCINE  02/28/2022   MAMMOGRAM  08/27/2022   COLONOSCOPY (Pts 45-43yr Insurance coverage will need to be confirmed)  11/26/2031   HIV Screening  Completed   Zoster Vaccines- Shingrix  Completed   HPV VACCINES  Aged Out   COVID-19 Vaccine  Discontinued   Hepatitis C Screening  Discontinued  Colonoscopy 11/25/21. Diverticulosis, repeat 10 yrs.  Mammogram next month.  Pap today. Last one in 2020.   Immunization History  Administered Date(s) Administered   Influenza,inj,Quad PF,6+ Mos 06/27/2021   PFIZER Comirnaty(Gray Top)Covid-19 Tri-Sucrose Vaccine 03/01/2020, 03/22/2020   PPD Test 08/05/2015, 08/10/2015, 08/30/2017, 09/03/2017   Tdap 12/30/2007   Zoster Recombinat (Shingrix) 06/27/2021, 09/15/2021  Flu  and tdap today.   No results found. Optho exam ok last year. Reading glasses.   Dental:Within Last 6 months  Alcohol: rare - less than 1 per week.   Tobacco: none, no vaping.   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  History Patient Active Problem List   Diagnosis Date Noted   Essential hypertension 06/24/2014   Past Medical History:  Diagnosis Date   Gestational diabetes 2009   Hypertension    Past Surgical History:  Procedure Laterality Date   CESAREAN SECTION  2009   triplets   TUBAL LIGATION  2009   No Known Allergies Prior to Admission medications   Medication Sig Start Date End Date Taking? Authorizing Provider  amLODipine (NORVASC) 5 MG tablet Take 0.5 tablets (2.5 mg total) by mouth daily. 07/03/22  Yes GWendie Agreste MD  lisinopril-hydrochlorothiazide (ZESTORETIC) 20-25 MG tablet TAKE 1 TABLET  BY MOUTH ONCE DAILY 07/03/22  Yes GWendie Agreste MD  potassium chloride SA (KLOR-CON M) 20 MEQ tablet Take 1 tablet (20 mEq total) by mouth daily. Initial dose 2 tabs. Repeat labs 1 week. 01/10/22  Yes GWendie Agreste MD   Social History   Socioeconomic History   Marital status: Married    Spouse name: [name]  Number of children: 7   Years of education: college   Highest education level: Not on file  Occupational History   Occupation: Childcare    Comment: ATeacher, early years/pre Tobacco Use   Smoking status: Never   Smokeless tobacco: Never  Vaping Use   Vaping Use: Never used  Substance and Sexual Activity   Alcohol use: Yes    Alcohol/week: 0.0 - 3.0 standard drinks of alcohol    Comment: wine on the weekends, reports not weekly   Drug use: Never   Sexual activity: Yes    Partners: Male    Birth control/protection: Surgical, Post-menopausal    Comment: BTL 2009 after the birth of triplets, sexually active with monogamous partner  Other Topics Concern   Not on file  Social History Narrative   [place].   Just graduated in 11/2016 [facility] [employer] in Early [employer]hood Development.Continuing on now for BA at [employer].   Lives with her husband and their 4 youngest children (triplets and a 162year old). Has 3 other grown children. (7 children total).       Exercise includes walking, stair climbing, playing with the children.       Diet includes baked chicken, fish, potatoes, salads and greens. Likes water and minute maid OJ. Gets enough dairy through milk, cheese, and yogurt.       Social Determinants of Health   Financial Resource Strain: Not on file  Food Insecurity: Not on file  Transportation Needs: Not on file  Physical Activity: Not on file  Stress: Not on file  Social Connections: Not on file  Intimate Partner Violence: Not on file    Review of Systems 13 point review of systems per patient health  survey noted.  Negative other than as indicated above or in HPI.    Objective:   Vitals:   08/28/22 1511  BP: 122/80  Pulse: 93  Resp: 17  Temp: 98.5 F (36.9 C)  TempSrc: Oral  SpO2: 99%  Weight: 173 lb (78.5 kg)  Height: '5\' 5"'$  (1.651 m)   {Vitals History (Optional):23777}  Physical Exam  Exam conducted with a chaperone present Marcille Blanco present for exam.).  Constitutional:      Appearance: She is well-developed.  HENT:     Head: Normocephalic and atraumatic.     Right Ear: External ear normal.     Left Ear: External ear normal.  Eyes:     Conjunctiva/sclera: Conjunctivae normal.     Pupils: Pupils are equal, round, and reactive to light.  Neck:     Thyroid: No thyromegaly.  Cardiovascular:     Rate and Rhythm: Normal rate and regular rhythm.     Heart sounds: Normal heart sounds. No murmur heard. Pulmonary:     Effort: Pulmonary effort is normal. No respiratory distress.     Breath sounds: Normal breath sounds. No wheezing.  Abdominal:     General: Bowel sounds are normal.     Palpations: Abdomen is soft.     Tenderness: There is no abdominal tenderness.  Genitourinary:    Exam position: Lithotomy position.     Labia:        Right: No rash or lesion.        Left: No rash or lesion.      Vagina: Normal.     Cervix: Cervical bleeding (slight frability, mimimal blood after brush with pap testing only.) present.     Uterus: Normal.      Adnexa: Right adnexa normal and left adnexa normal.       Right: No tenderness.         Left: No tenderness.    Musculoskeletal:        General: No tenderness. Normal range of motion.     Cervical back: Normal range of motion and neck supple.     Comments: C-spine nontender, range of motion of C-spine does not reproduce shoulder pain. Left shoulder: Full range of motion.  Slight discomfort at terminal internal rotation, lateral shoulder discomfort, reproducible discomfort at the deltoid origin and slightly posterior, also at  acromion.  Negative crossover. [place], clavicle nontender.  Full rotator cuff strength, negative Neer, slight discomfort with Hawkins posterolateral.   Lymphadenopathy:     Cervical: No cervical adenopathy.  Skin:    General: Skin is warm and dry.     Findings: No rash.  Neurological:     Mental Status: She is alert and oriented to person, place, and time.  Psychiatric:        Behavior: Behavior normal.        Thought Content: Thought content normal.      Assessment & Plan:  Inola Lisle is a 52 y.o. female . Annual physical exam  Hypokalemia  Prediabetes - Plan: Hemoglobin A1c, Comprehensive metabolic panel, Lipid panel  Essential hypertension - Plan: Comprehensive metabolic panel  Pain of left deltoid - Plan: DG Shoulder Left, meloxicam (MOBIC) 7.5 MG tablet  Chronic left shoulder pain - Plan: DG Shoulder Left, meloxicam (MOBIC) 7.5 MG tablet  Screening for cervical cancer - Plan: Cytology - PAP  Needs flu shot - Plan: Flu Vaccine QUAD 6+ mos PF IM (Fluarix Quad PF)  Need for Tdap vaccination - Plan: Tdap vaccine greater than or equal to 54yo IM   Meds ordered this encounter  Medications   meloxicam (MOBIC) 7.5 MG tablet    Sig: Take 1 tablet (7.5 mg total) by mouth daily.    Dispense:  30 tablet    Refill:  0   Patient Instructions  Try slowly incorporate more fiber in diet or fiber supplement like metamucil. Follow  up if soft stools not improving or worsening.  Blood pressure looks good.  I will check labs today, but restart potassium daily.  Cut back on sweet tea. Water is best.  Please have x-ray performed at the [facility] location located below.  Meloxicam 1 pill/day temporarily may help with that shoulder pain, but as we discussed some of that could be from repetitive lifting.  Follow-up in the next few weeks and we can discuss treatment options further including possible orthopedic eval or physical therapy if that pain is not improving.  Thank you for  coming today and take care.   Preventive Care 44-48 Years Old, Female Preventive care refers to lifestyle choices and visits with your health care provider that can promote health and wellness. Preventive care visits are also called wellness exams. What can I expect for my preventive care visit? Counseling Your health care provider may ask you questions about your: Medical history, including: Past medical problems. Family medical history. Pregnancy history. Current health, including: Menstrual cycle. Method of birth control. Emotional well-being. Home life and relationship well-being. Sexual activity and sexual health. Lifestyle, including: Alcohol, nicotine or tobacco, and drug use. Access to firearms. Diet, exercise, and sleep habits. Work and work Statistician. Sunscreen use. Safety issues such as seatbelt and bike helmet use. Physical exam Your health care provider will check your: Height and weight. These may be used to calculate your BMI (body mass index). BMI is a measurement that tells if you are at a healthy weight. Waist circumference. This measures the distance around your waistline. This measurement also tells if you are at a healthy weight and may help predict your risk of certain diseases, such as type 2 diabetes and high blood pressure. Heart rate and blood pressure. Body temperature. Skin for abnormal spots. What immunizations do I need?  Vaccines are usually given at various ages, according to a schedule. Your health care provider will recommend vaccines for you based on your age, medical history, and lifestyle or other factors, such as travel or where you work. What tests do I need? Screening Your health care provider may recommend screening tests for certain conditions. This may include: Lipid and cholesterol levels. Diabetes screening. This is done by checking your blood sugar (glucose) after you have not eaten for a while (fasting). Pelvic exam and Pap  test. Hepatitis B test. Hepatitis C test. HIV (human immunodeficiency virus) test. STI (sexually transmitted infection) testing, if you are at risk. Lung cancer screening. Colorectal cancer screening. Mammogram. Talk with your health care provider about when you should start having regular mammograms. This may depend on whether you have a family history of breast cancer. BRCA-related cancer screening. This may be done if you have a family history of breast, ovarian, tubal, or peritoneal cancers. Bone density scan. This is done to screen for osteoporosis. Talk with your health care provider about your test results, treatment options, and if necessary, the need for more tests. Follow these instructions at home: Eating and drinking  Eat a diet that includes fresh fruits and vegetables, whole grains, lean protein, and low-fat dairy products. Take vitamin and mineral supplements as recommended by your health care provider. Do not drink alcohol if: Your health care provider tells you not to drink. You are pregnant, may be pregnant, or are planning to become pregnant. If you drink alcohol: Limit how much you have to 0-1 drink a day. Know how much alcohol is in your drink. In the [place] one drink equals one  12 oz bottle of beer (355 mL), one 5 oz glass of wine (148 mL), or one 1 oz glass of hard liquor (44 mL). Lifestyle Brush your teeth every morning and night with fluoride toothpaste. Floss one time each day. Exercise for at least 30 minutes 5 or more days each week. Do not use any products that contain nicotine or tobacco. These products include cigarettes, chewing tobacco, and vaping devices, such as e-cigarettes. If you need help quitting, ask your health care provider. Do not use drugs. If you are sexually active, practice safe sex. Use a condom or other form of protection to prevent STIs. If you do not wish to become pregnant, use a form of birth control. If you plan to become pregnant,  see your health care provider for a prepregnancy visit. Take aspirin only as told by your health care provider. Make sure that you understand how much to take and what form to take. Work with your health care provider to find out whether it is safe and beneficial for you to take aspirin daily. Find healthy ways to manage stress, such as: Meditation, yoga, or listening to music. Journaling. Talking to a trusted person. Spending time with friends and family. Minimize exposure to UV radiation to reduce your risk of skin cancer. Safety Always wear your seat belt while driving or riding in a vehicle. Do not drive: If you have been drinking alcohol. Do not ride with someone who has been drinking. When you are tired or distracted. While texting. If you have been using any mind-altering substances or drugs. Wear a helmet and other protective equipment during sports activities. If you have firearms in your house, make sure you follow all gun safety procedures. Seek help if you have been physically or sexually abused. What's next? Visit your health care provider once a year for an annual wellness visit. Ask your health care provider how often you should have your eyes and teeth checked. Stay up to date on all vaccines. This information is not intended to replace advice given to you by your health care provider. Make sure you discuss any questions you have with your health care provider. Document Revised: 01/12/2021 Document Reviewed: 01/12/2021 Elsevier Patient Education  [place].  Shoulder Pain Many things can cause shoulder pain, including: An injury to the shoulder. Overuse of the shoulder. Arthritis. The source of the pain can be: Inflammation. An injury to the shoulder joint. An injury to a tendon, ligament, or bone. Follow these instructions at home: Pay attention to changes in your symptoms. Let your health care provider know about them. Follow these instructions to  relieve your pain. If you have a sling: Wear the sling as told by your health care provider. Remove it only as told by your health care provider. Loosen the sling if your fingers tingle, become numb, or turn cold and blue. Keep the sling clean. If the sling is not waterproof: Do not let it get wet. Remove it to shower or bathe. Move your arm as little as possible, but keep your hand moving to prevent swelling. Managing pain, stiffness, and swelling  If directed, put ice on the painful area: Put ice in a plastic bag. Place a towel between your skin and the bag. Leave the ice on for 20 minutes, 2-3 times per day. Stop applying ice if it does not help with the pain. Squeeze a soft ball or a foam pad as much as possible. This helps to keep the shoulder from  swelling. It also helps to strengthen the arm. General instructions Take over-the-counter and prescription medicines only as told by your health care provider. Keep all follow-up visits as told by your health care provider. This is important. Contact a health care provider if: Your pain gets worse. Your pain is not relieved with medicines. New pain develops in your arm, hand, or fingers. Get help right away if: Your arm, hand, or fingers: Tingle. Become numb. Become swollen. Become painful. Turn white or blue. Summary Shoulder pain can be caused by an injury, overuse, or arthritis. Pay attention to changes in your symptoms. Let your health care provider know about them. This condition may be treated with a sling, ice, and pain medicines. Contact your health care provider if the pain gets worse or new pain develops. Get help right away if your arm, hand, or fingers tingle or become numb, swollen, or painful. Keep all follow-up visits as told by your health care provider. This is important. This information is not intended to replace advice given to you by your health care provider. Make sure you discuss any questions you have with  your health care provider. Document Revised: 04/01/2021 Document Reviewed: 04/01/2021 Elsevier Patient Education  2023 [place],   Merri Ray, MD [place], [facility] 08/28/22 4:27 PM

## 2022-08-29 LAB — COMPREHENSIVE METABOLIC PANEL
ALT: 12 U/L (ref 0–35)
AST: 14 U/L (ref 0–37)
Albumin: 4.5 g/dL (ref 3.5–5.2)
Alkaline Phosphatase: 54 U/L (ref 39–117)
BUN: 20 mg/dL (ref 6–23)
CO2: 33 mEq/L — ABNORMAL HIGH (ref 19–32)
Calcium: 9.6 mg/dL (ref 8.4–10.5)
Chloride: 100 mEq/L (ref 96–112)
Creatinine, Ser: 0.91 mg/dL (ref 0.40–1.20)
GFR: 72.23 mL/min (ref >60.00)
Glucose, Bld: 77 mg/dL (ref 70–99)
Potassium: 3.3 mEq/L — ABNORMAL LOW (ref 3.5–5.1)
Sodium: 139 mEq/L (ref 135–145)
Total Bilirubin: 0.5 mg/dL (ref 0.2–1.2)
Total Protein: 7.4 g/dL (ref 6.0–8.3)

## 2022-08-29 LAB — LIPID PANEL
Cholesterol: 231 mg/dL — ABNORMAL HIGH (ref 0–200)
HDL: 55.2 mg/dL (ref >39.00)
LDL Cholesterol: 137 mg/dL — ABNORMAL HIGH (ref 0–99)
NonHDL: 176.1
Total CHOL/HDL Ratio: 4
Triglycerides: 198 mg/dL — ABNORMAL HIGH (ref 0.0–149.0)
VLDL: 39.6 mg/dL (ref 0.0–40.0)

## 2022-08-29 LAB — HEMOGLOBIN A1C: Hgb A1c MFr Bld: 6.6 % — ABNORMAL HIGH (ref 4.6–6.5)

## 2022-08-31 LAB — CYTOLOGY - PAP
Comment: NEGATIVE
Diagnosis: NEGATIVE
High risk HPV: NEGATIVE

## 2022-09-13 ENCOUNTER — Telehealth: Payer: Self-pay | Admitting: Family Medicine

## 2022-09-13 NOTE — Telephone Encounter (Signed)
Caller name: Starsha Schoene  On DPR?: Yes  Call back number: [phone] (mobile)  Provider they see: Wendie Agreste, MD  Reason for call:Pt called stating someone called her left NO message.

## 2022-09-13 NOTE — Telephone Encounter (Signed)
Appointment reminder call

## 2022-09-18 ENCOUNTER — Encounter: Payer: 59 | Admitting: Family Medicine

## 2022-09-20 NOTE — Progress Notes (Signed)
This encounter was created in error - please disregard.

## 2022-09-28 ENCOUNTER — Ambulatory Visit
Admission: EM | Admit: 2022-09-28 | Discharge: 2022-09-28 | Disposition: A | Discharge status: Home / Self Care | Payer: 59 | Attending: Physician Assistant | Admitting: Physician Assistant

## 2022-09-28 DIAGNOSIS — M25512 Pain in left shoulder: Secondary | ICD-10-CM | POA: Diagnosis not present

## 2022-09-28 DIAGNOSIS — S46912A Strain of unspecified muscle, fascia and tendon at shoulder and upper arm level, left arm, initial encounter: Secondary | ICD-10-CM | POA: Diagnosis not present

## 2022-09-28 MED ORDER — IBUPROFEN 600 MG PO TABS: 600.0000 mg | ORAL_TABLET | Freq: Four times a day (QID) | ORAL | 0 refills | Status: DC | PRN

## 2022-09-28 MED ORDER — TIZANIDINE HCL 4 MG PO TABS: 4.0000 mg | ORAL_TABLET | Freq: Four times a day (QID) | ORAL | 0 refills | Status: DC | PRN
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
## 2022-09-28 NOTE — Discharge Instructions (Signed)
Advised to continue with ice therapy, 10 minutes on 20 minutes off, 3-4 times throughout the day to help reduce pain and swelling. Advised take ibuprofen 600 mg every 6 hours with food on a regular basis for the next couple days to help decrease acute pain. Advised take Zanaflex 4 mg every 8 hours as needed to prevent muscle spasm.  Advised follow-up PCP or return to urgent care as needed.
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
## 2022-10-09 ENCOUNTER — Ambulatory Visit (INDEPENDENT_AMBULATORY_CARE_PROVIDER_SITE_OTHER)
Admission: RE | Admit: 2022-10-09 | Discharge: 2022-10-09 | Disposition: A | Discharge status: Home / Self Care | Payer: 59 | Source: Ambulatory Visit | Attending: Family Medicine | Admitting: Family Medicine

## 2022-10-09 ENCOUNTER — Other Ambulatory Visit: Payer: Self-pay | Admitting: Family Medicine

## 2022-10-09 ENCOUNTER — Telehealth: Payer: Self-pay | Admitting: Family Medicine

## 2022-10-09 ENCOUNTER — Other Ambulatory Visit (INDEPENDENT_AMBULATORY_CARE_PROVIDER_SITE_OTHER): Payer: 59

## 2022-10-09 DIAGNOSIS — M25512 Pain in left shoulder: Secondary | ICD-10-CM

## 2022-10-09 DIAGNOSIS — M7918 Myalgia, other site: Secondary | ICD-10-CM | POA: Diagnosis not present

## 2022-10-09 DIAGNOSIS — G8929 Other chronic pain: Secondary | ICD-10-CM

## 2022-10-09 DIAGNOSIS — E876 Hypokalemia: Secondary | ICD-10-CM | POA: Diagnosis not present

## 2022-10-09 LAB — BASIC METABOLIC PANEL
BUN: 17 mg/dL (ref 6–23)
CO2: 32 mEq/L (ref 19–32)
Calcium: 10.1 mg/dL (ref 8.4–10.5)
Chloride: 100 mEq/L (ref 96–112)
Creatinine, Ser: 0.93 mg/dL (ref 0.40–1.20)
GFR: 70.32 mL/min (ref >60.00)
Glucose, Bld: 123 mg/dL — ABNORMAL HIGH (ref 70–99)
Potassium: 3.8 mEq/L (ref 3.5–5.1)
Sodium: 139 mEq/L (ref 135–145)

## 2022-10-09 NOTE — Progress Notes (Signed)
Lab visit

## 2022-10-09 NOTE — Telephone Encounter (Signed)
Caller name: Samira Lomboy  On DPR?: Yes  Call back number: [phone] (mobile)  Provider they see: Wendie Agreste, MD  Reason for call: [facility] lab visit must be changed because they have no record and it needs to be be changed to future visit.

## 2022-10-16 ENCOUNTER — Telehealth: Payer: Self-pay

## 2022-10-16 DIAGNOSIS — G8929 Other chronic pain: Secondary | ICD-10-CM

## 2022-10-16 NOTE — Telephone Encounter (Signed)
Pt did see message when it had come through [facility] states it is not better so she wants to go ahead with the referral to ortho now.

## 2022-10-16 NOTE — Telephone Encounter (Signed)
Noted - I will place referral. Please let her know she should be receiving a call from ortho in next 2 weeks.  Let me know if she does not receive the call.

## 2022-10-16 NOTE — Addendum Note (Signed)
Addended by: Merri Ray R on: 10/16/2022 09:29 PM   Modules accepted: Orders

## 2022-10-16 NOTE — Telephone Encounter (Signed)
-----   Message from Wendie Agreste, MD sent at 10/15/2022  2:12 PM EDT ----- Results sent by MyChart, but appears patient has not yet reviewed those results.  Please call and make sure they have either seen note or discuss result note.  Thanks.

## 2022-10-17 NOTE — Telephone Encounter (Signed)
Patient has been notified

## 2022-10-24 ENCOUNTER — Other Ambulatory Visit: Payer: Self-pay

## 2022-10-24 ENCOUNTER — Encounter: Payer: Self-pay | Admitting: Sports Medicine

## 2022-10-24 ENCOUNTER — Ambulatory Visit: Payer: 59 | Admitting: Sports Medicine

## 2022-10-24 DIAGNOSIS — M24012 Loose body in left shoulder: Secondary | ICD-10-CM | POA: Diagnosis not present

## 2022-10-24 DIAGNOSIS — M19012 Primary osteoarthritis, left shoulder: Secondary | ICD-10-CM | POA: Diagnosis not present

## 2022-10-24 DIAGNOSIS — M25512 Pain in left shoulder: Secondary | ICD-10-CM | POA: Diagnosis not present

## 2022-10-24 DIAGNOSIS — G8929 Other chronic pain: Secondary | ICD-10-CM | POA: Diagnosis not present

## 2022-10-24 MED ORDER — METHYLPREDNISOLONE ACETATE 40 MG/ML IJ SUSP
80.0000 mg | INTRAMUSCULAR | Status: AC | PRN
  Administered 2022-10-24: 80 mg via INTRA_ARTICULAR

## 2022-10-24 MED ORDER — BUPIVACAINE HCL 0.25 % IJ SOLN
2.0000 mL | INTRAMUSCULAR | Status: AC | PRN
  Administered 2022-10-24: 2 mL via INTRA_ARTICULAR

## 2022-10-24 MED ORDER — LIDOCAINE HCL 1 % IJ SOLN
2.0000 mL | INTRAMUSCULAR | Status: AC | PRN
  Administered 2022-10-24: 2 mL
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
## 2022-10-24 NOTE — Progress Notes (Signed)
L HD female History of shoulder pain intermittently over the last few years No known injury Denies radicular pain  Seen by Dr. Carlota Raspberry and given MR to help with pain; minimal relief No other treatment for this shoulder  Xrays were done by UC  Patient was instructed in 10 minutes of therapeutic exercises for left shoulder pain to improve strength, ROM and function according to my instructions and plan of care by a Certified Athletic Trainer during the office visit. A customized handout was provided and demonstration of proper technique shown and discussed. Patient did perform exercises and demonstrate understanding through teachback.  All questions discussed and answered.

## 2022-11-13 ENCOUNTER — Other Ambulatory Visit: Payer: Self-pay | Admitting: Family Medicine

## 2022-11-13 DIAGNOSIS — Z1231 Encounter for screening mammogram for malignant neoplasm of breast: Secondary | ICD-10-CM

## 2022-11-28 ENCOUNTER — Ambulatory Visit
Admission: RE | Admit: 2022-11-28 | Discharge: 2022-11-28 | Disposition: A | Discharge status: Home / Self Care | Payer: 59 | Source: Ambulatory Visit | Attending: Family Medicine | Admitting: Family Medicine

## 2022-11-28 DIAGNOSIS — Z1231 Encounter for screening mammogram for malignant neoplasm of breast: Secondary | ICD-10-CM

## 2022-12-18 ENCOUNTER — Encounter: Payer: Self-pay | Admitting: Family Medicine

## 2022-12-18 ENCOUNTER — Ambulatory Visit: Payer: 59 | Admitting: Family Medicine

## 2022-12-18 VITALS — BP 138/90 | HR 74 | Temp 98.0°F | Ht 65.0 in | Wt 168.6 lb

## 2022-12-18 DIAGNOSIS — R7303 Prediabetes: Secondary | ICD-10-CM | POA: Diagnosis not present

## 2022-12-18 DIAGNOSIS — I10 Essential (primary) hypertension: Secondary | ICD-10-CM

## 2022-12-18 DIAGNOSIS — E876 Hypokalemia: Secondary | ICD-10-CM | POA: Diagnosis not present

## 2022-12-18 LAB — HEMOGLOBIN A1C: Hgb A1c MFr Bld: 6.1 % (ref 4.6–6.5)

## 2022-12-18 LAB — BASIC METABOLIC PANEL
BUN: 12 mg/dL (ref 6–23)
CO2: 30 mEq/L (ref 19–32)
Calcium: 10.5 mg/dL (ref 8.4–10.5)
Chloride: 96 mEq/L (ref 96–112)
Creatinine, Ser: 0.97 mg/dL (ref 0.40–1.20)
GFR: 66.76 mL/min (ref >60.00)
Glucose, Bld: 118 mg/dL — ABNORMAL HIGH (ref 70–99)
Potassium: 3.5 mEq/L (ref 3.5–5.1)
Sodium: 139 mEq/L (ref 135–145)

## 2022-12-18 MED ORDER — AMLODIPINE BESYLATE 5 MG PO TABS
2.5000 mg | ORAL_TABLET | Freq: Every day | ORAL | 1 refills | Status: AC
Start: 2022-12-18 — End: ?

## 2022-12-18 MED ORDER — POTASSIUM CHLORIDE CRYS ER 20 MEQ PO TBCR
20.0000 meq | EXTENDED_RELEASE_TABLET | Freq: Every day | ORAL | 1 refills | Status: DC
Start: 2022-12-18 — End: 2023-05-21

## 2022-12-18 MED ORDER — LISINOPRIL-HYDROCHLOROTHIAZIDE 20-25 MG PO TABS
ORAL_TABLET | ORAL | 1 refills | Status: AC
Start: 2022-12-18 — End: ?

## 2022-12-18 NOTE — Progress Notes (Unsigned)
Subjective:  Patient ID: Carrie Greer, female    DOB: 07/05/1971  Age: 52 y.o. MRN: [id number]  CC:  Chief Complaint  Patient presents with   Follow-up    Pt states she is following up on her potassium.     HPI Carrie Greer presents for  Follow up.  Dtr grauated form [employer] recently.  Did ok over 1st Mother's Day without her mom.   Hypokalemia: 3.3 in January, improved to 3.8 March 11.  Currently on potassium supplementation, potassium chloride 20 mEq daily.  No heart palpitations, fatigue. Feels ok. Arm feels ok now.   Prediabetes: History of prediabetes, most recent A1c's have been at diabetic range, most recently 6.6 in January, previously 6.5 Still exercising 30 mins per day. Trying to avoid sugar beverages. Weight improved 5#.  Lab Results  Component Value Date   HGBA1C 6.6 (H) 08/28/2022   Wt Readings from Last 3 Encounters:  12/18/22 168 lb 9.6 oz (76.5 kg)  08/28/22 173 lb (78.5 kg)  12/19/21 170 lb 3.2 oz (77.2 kg)   Hypertension: Treated with lisinopril HCTZ 20/25 mg daily, amlodipine 5 mg - 1/2 pill.  Busy weekend, had a few more alcoholic drinks than usual past week - 4 drinks for whole week - none yesterday, 2 on Saturday.  Home readings: 130/88 range.  BP Readings from Last 3 Encounters:  12/18/22 (!) 154/98  09/28/22 126/78  08/28/22 122/80   Lab Results  Component Value Date   CREATININE 0.93 10/09/2022     History Patient Active Problem List   Diagnosis Date Noted   Essential hypertension 06/24/2014   Past Medical History:  Diagnosis Date   Gestational diabetes 2009   Hypertension    Past Surgical History:  Procedure Laterality Date   CESAREAN SECTION  2009   triplets   TUBAL LIGATION  2009   No Known Allergies Prior to Admission medications   Medication Sig Start Date End Date Taking? Authorizing Provider  amLODipine (NORVASC) 5 MG tablet Take 0.5 tablets (2.5 mg total) by mouth daily. 08/28/22  Yes Shade Flood, MD  ibuprofen (ADVIL) 600 MG tablet Take 1 tablet (600 mg total) by mouth every 6 (six) hours as needed. 09/28/22  Yes Ellsworth Lennox, PA-C  lisinopril-hydrochlorothiazide (ZESTORETIC) 20-25 MG tablet TAKE 1 TABLET BY MOUTH ONCE DAILY 08/28/22  Yes Shade Flood, MD  potassium chloride SA (KLOR-CON M) 20 MEQ tablet Take 1 tablet (20 mEq total) by mouth daily. 08/28/22  Yes Shade Flood, MD  tiZANidine (ZANAFLEX) 4 MG tablet Take 1 tablet (4 mg total) by mouth every 6 (six) hours as needed for muscle spasms. 09/28/22  Yes Ellsworth Lennox, PA-C  meloxicam (MOBIC) 7.5 MG tablet Take 1 tablet (7.5 mg total) by mouth daily. Patient not taking: Reported on 12/18/2022 08/28/22   Shade Flood, MD   Social History   Socioeconomic History   Marital status: Married    Spouse name: Ronnald Nian   Number of children: 7   Years of education: college   Highest education level: Not on file  Occupational History   Occupation: Childcare    Comment: Medical illustrator  Tobacco Use   Smoking status: Never   Smokeless tobacco: Never  Vaping Use   Vaping Use: Never used  Substance and Sexual Activity   Alcohol use: Yes    Alcohol/week: 0.0 - 3.0 standard drinks of alcohol    Comment: wine on the weekends, reports not weekly  Drug use: Never   Sexual activity: Yes    Partners: Male    Birth control/protection: Surgical, Post-menopausal    Comment: BTL 2009 after the birth of triplets, sexually active with monogamous partner  Other Topics Concern   Not on file  Social History Narrative   Pt is from [place]. Has been in [place] since 2005.   Just graduated in 11/2016 at [employer] with degree in Early Childhood Development.Continuing on now for BA at [employer].   Lives with her husband and their 4 youngest children (triplets and a 17 year old). Has 3 other grown children. (7 children total).       Exercise includes walking, stair climbing, playing with the children.       Diet  includes baked chicken, fish, potatoes, salads and greens. Likes water and minute maid OJ. Gets enough dairy through milk, cheese, and yogurt.       Social Determinants of Health   Financial Resource Strain: Not on file  Food Insecurity: Not on file  Transportation Needs: Not on file  Physical Activity: Not on file  Stress: Not on file  Social Connections: Not on file  Intimate Partner Violence: Not on file    Review of Systems  Constitutional:  Negative for fatigue and unexpected weight change.  Respiratory:  Negative for chest tightness and shortness of breath.   Cardiovascular:  Negative for chest pain, palpitations and leg swelling.  Gastrointestinal:  Negative for abdominal pain and blood in stool.  Neurological:  Negative for dizziness, syncope, light-headedness and headaches.     Objective:   Vitals:   12/18/22 1152  BP: (!) 154/98  Pulse: 74  Temp: 98 F (36.7 C)  TempSrc: Temporal  SpO2: 100%  Weight: 168 lb 9.6 oz (76.5 kg)  Height: 5\' 5"  (1.651 m)     Physical Exam Vitals reviewed.  Constitutional:      Appearance: Normal appearance. She is well-developed.  HENT:     Head: Normocephalic and atraumatic.  Eyes:     Conjunctiva/sclera: Conjunctivae normal.     Pupils: Pupils are equal, round, and reactive to light.  Neck:     Vascular: No carotid bruit.  Cardiovascular:     Rate and Rhythm: Normal rate and regular rhythm.     Heart sounds: Normal heart sounds.  Pulmonary:     Effort: Pulmonary effort is normal.     Breath sounds: Normal breath sounds.  Abdominal:     Palpations: Abdomen is soft. There is no pulsatile mass.     Tenderness: There is no abdominal tenderness.  Musculoskeletal:     Right lower leg: No edema.     Left lower leg: No edema.  Skin:    General: Skin is warm and dry.  Neurological:     Mental Status: She is alert and oriented to person, place, and time.  Psychiatric:        Mood and Affect: Mood normal.        Behavior:  Behavior normal.        Assessment & Plan:  Carrie Greer is a 52 y.o. female . Hypokalemia   No orders of the defined types were placed in this encounter.  There are no Patient Instructions on file for this visit.    Signed,   Meredith Staggers, MD [facility], [facility] 12/18/22 11:58 AM

## 2022-12-18 NOTE — Patient Instructions (Addendum)
Increase to full pill of amlodipine if blood pressure remains over 130/80 at home.  No other med changes for now. Congrats on the weight loss!   Managing Your Hypertension Hypertension, also called high blood pressure, is when the force of the blood pressing against the walls of the arteries is too strong. Arteries are blood vessels that carry blood from your heart throughout your body. Hypertension forces the heart to work harder to pump blood and may cause the arteries to become narrow or stiff. Understanding blood pressure readings A blood pressure reading includes a higher number over a lower number: The first, or top, number is called the systolic pressure. It is a measure of the pressure in your arteries as your heart beats. The second, or bottom number, is called the diastolic pressure. It is a measure of the pressure in your arteries as the heart relaxes. For most people, a normal blood pressure is below 120/80. Your personal target blood pressure may vary depending on your medical conditions, your age, and other factors. Blood pressure is classified into four stages. Based on your blood pressure reading, your health care provider may use the following stages to determine what type of treatment you need, if any. Systolic pressure and diastolic pressure are measured in a unit called millimeters of mercury (mmHg). Normal Systolic pressure: below 120. Diastolic pressure: below 80. Elevated Systolic pressure: 120-129. Diastolic pressure: below 80. Hypertension stage 1 Systolic pressure: 130-139. Diastolic pressure: 80-89. Hypertension stage 2 Systolic pressure: 140 or above. Diastolic pressure: 90 or above. How can this condition affect me? Managing your hypertension is very important. Over time, hypertension can damage the arteries and decrease blood flow to parts of the body, including the brain, heart, and kidneys. Having untreated or uncontrolled hypertension can lead to: A heart  attack. A stroke. A weakened blood vessel (aneurysm). Heart failure. Kidney damage. Eye damage. Memory and concentration problems. Vascular dementia. What actions can I take to manage this condition? Hypertension can be managed by making lifestyle changes and possibly by taking medicines. Your health care provider will help you make a plan to bring your blood pressure within a normal range. You may be referred for counseling on a healthy diet and physical activity. Nutrition  Eat a diet that is high in fiber and potassium, and low in salt (sodium), added sugar, and fat. An example eating plan is called the DASH diet. DASH stands for Dietary Approaches to Stop Hypertension. To eat this way: Eat plenty of fresh fruits and vegetables. Try to fill one-half of your plate at each meal with fruits and vegetables. Eat whole grains, such as whole-wheat pasta, brown rice, or whole-grain bread. Fill about one-fourth of your plate with whole grains. Eat low-fat dairy products. Avoid fatty cuts of meat, processed or cured meats, and poultry with skin. Fill about one-fourth of your plate with lean proteins such as fish, chicken without skin, beans, eggs, and tofu. Avoid pre-made and processed foods. These tend to be higher in sodium, added sugar, and fat. Reduce your daily sodium intake. Many people with hypertension should eat less than 1,500 mg of sodium a day. Lifestyle  Work with your health care provider to maintain a healthy body weight or to lose weight. Ask what an ideal weight is for you. Get at least 30 minutes of exercise that causes your heart to beat faster (aerobic exercise) most days of the week. Activities may include walking, swimming, or biking. Include exercise to strengthen your muscles (resistance exercise),  such as weight lifting, as part of your weekly exercise routine. Try to do these types of exercises for 30 minutes at least 3 days a week. Do not use any products that contain  nicotine or tobacco. These products include cigarettes, chewing tobacco, and vaping devices, such as e-cigarettes. If you need help quitting, ask your health care provider. Control any long-term (chronic) conditions you have, such as high cholesterol or diabetes. Identify your sources of stress and find ways to manage stress. This may include meditation, deep breathing, or making time for fun activities. Alcohol use Do not drink alcohol if: Your health care provider tells you not to drink. You are pregnant, may be pregnant, or are planning to become pregnant. If you drink alcohol: Limit how much you have to: 0-1 drink a day for women. 0-2 drinks a day for men. Know how much alcohol is in your drink. In the [place] one drink equals one 12 oz bottle of beer (355 mL), one 5 oz glass of wine (148 mL), or one 1 oz glass of hard liquor (44 mL). Medicines Your health care provider may prescribe medicine if lifestyle changes are not enough to get your blood pressure under control and if: Your systolic blood pressure is 130 or higher. Your diastolic blood pressure is 80 or higher. Take medicines only as told by your health care provider. Follow the directions carefully. Blood pressure medicines must be taken as told by your health care provider. The medicine does not work as well when you skip doses. Skipping doses also puts you at risk for problems. Monitoring Before you monitor your blood pressure: Do not smoke, drink caffeinated beverages, or exercise within 30 minutes before taking a measurement. Use the bathroom and empty your bladder (urinate). Sit quietly for at least 5 minutes before taking measurements. Monitor your blood pressure at home as told by your health care provider. To do this: Sit with your back straight and supported. Place your feet flat on the floor. Do not cross your legs. Support your arm on a flat surface, such as a table. Make sure your upper arm is at heart level. Each  time you measure, take two or three readings one minute apart and record the results. You may also need to have your blood pressure checked regularly by your health care provider. General information Talk with your health care provider about your diet, exercise habits, and other lifestyle factors that may be contributing to hypertension. Review all the medicines you take with your health care provider because there may be side effects or interactions. Keep all follow-up visits. Your health care provider can help you create and adjust your plan for managing your high blood pressure. Where to find more information National Heart, Lung, and Blood Institute: [employer] American Heart Association: www.heart.org Contact a health care provider if: You think you are having a reaction to medicines you have taken. You have repeated (recurrent) headaches. You feel dizzy. You have swelling in your ankles. You have trouble with your vision. Get help right away if: You develop a severe headache or confusion. You have unusual weakness or numbness, or you feel faint. You have severe pain in your chest or abdomen. You vomit repeatedly. You have trouble breathing. These symptoms may be an emergency. Get help right away. Call 911. Do not wait to see if the symptoms will go away. Do not drive yourself to the hospital. Summary Hypertension is when the force of blood pumping through your arteries is too  strong. If this condition is not controlled, it may put you at risk for serious complications. Your personal target blood pressure may vary depending on your medical conditions, your age, and other factors. For most people, a normal blood pressure is less than 120/80. Hypertension is managed by lifestyle changes, medicines, or both. Lifestyle changes to help manage hypertension include losing weight, eating a healthy, low-sodium diet, exercising more, stopping smoking, and limiting alcohol. This  information is not intended to replace advice given to you by your health care provider. Make sure you discuss any questions you have with your health care provider. Document Revised: 03/31/2021 Document Reviewed: 03/31/2021 Elsevier Patient Education  2023 [employer].

## 2022-12-19 ENCOUNTER — Encounter: Payer: Self-pay | Admitting: Family Medicine

## 2023-04-12 ENCOUNTER — Other Ambulatory Visit: Payer: 59 | Admitting: Pharmacist

## 2023-04-12 NOTE — Progress Notes (Signed)
Pharmacy Quality Measure Review - hypertension   This patient is appearing on a report for last office blood pressure being >/= to 140/90.   Last documented BP 138/90 on 12/18/2022  Hypertension:  Current medications: amlodipine 5mg  - take 0.5 to 1 tablet daily and lisinopril hydrochlorothiazide 20/25mg  daily    Patient has a validated, automated, upper arm home BP cuff Today's blood pressure reading was 136/80 HR 70 Recent blood pressure readings readings: 134/79, 127/80, 130/80  Patient denies hypotensive s/sx including no dizziness, lightheadedness.  Patient denies hypertensive symptoms including no headache, chest pain, shortness of breath   Assessment / Plan Hypertension: Home blood pressure readings and today's blood pressure reading has been at goal of < 140/90.  - Reviewed long term cardiovascular and renal outcomes of uncontrolled blood pressure - Recommended to check home blood pressure and heart rate 2 to 3 times per week and bring to next office visit with PCP.  - Recommend to continue  lisinopril hydrochlorothiazide 20/25mg  daily and amlodipine 5mg  - take 0.5 tablet daily (increase to 1 tablet if blood pressure begins to rise above 140/90).   - Assisted in making follow up appt with PCP for 05/14/2023   Henrene Pastor, PharmD Clinical Pharmacist [facility]  Population Health [phone]

## 2023-05-14 ENCOUNTER — Encounter: Payer: Self-pay | Admitting: Family Medicine

## 2023-05-14 ENCOUNTER — Ambulatory Visit (INDEPENDENT_AMBULATORY_CARE_PROVIDER_SITE_OTHER): Payer: 59 | Admitting: Family Medicine

## 2023-05-14 VITALS — BP 152/100 | HR 94 | Temp 98.4°F | Ht 65.0 in | Wt 183.5 lb

## 2023-05-14 DIAGNOSIS — R7303 Prediabetes: Secondary | ICD-10-CM | POA: Diagnosis not present

## 2023-05-14 DIAGNOSIS — Z23 Encounter for immunization: Secondary | ICD-10-CM | POA: Diagnosis not present

## 2023-05-14 DIAGNOSIS — I10 Essential (primary) hypertension: Secondary | ICD-10-CM | POA: Diagnosis not present

## 2023-05-14 DIAGNOSIS — E785 Hyperlipidemia, unspecified: Secondary | ICD-10-CM | POA: Diagnosis not present

## 2023-05-14 DIAGNOSIS — E876 Hypokalemia: Secondary | ICD-10-CM | POA: Diagnosis not present

## 2023-05-14 LAB — COMPREHENSIVE METABOLIC PANEL
ALT: 16 U/L (ref 0–35)
AST: 15 U/L (ref 0–37)
Albumin: 4.6 g/dL (ref 3.5–5.2)
Alkaline Phosphatase: 63 U/L (ref 39–117)
BUN: 16 mg/dL (ref 6–23)
CO2: 30 meq/L (ref 19–32)
Calcium: 10.1 mg/dL (ref 8.4–10.5)
Chloride: 101 meq/L (ref 96–112)
Creatinine, Ser: 0.91 mg/dL (ref 0.40–1.20)
GFR: 71.88 mL/min (ref >60.00)
Glucose, Bld: 162 mg/dL — ABNORMAL HIGH (ref 70–99)
Potassium: 3.3 meq/L — ABNORMAL LOW (ref 3.5–5.1)
Sodium: 139 meq/L (ref 135–145)
Total Bilirubin: 0.7 mg/dL (ref 0.2–1.2)
Total Protein: 8 g/dL (ref 6.0–8.3)

## 2023-05-14 LAB — HEMOGLOBIN A1C: Hgb A1c MFr Bld: 6.5 % (ref 4.6–6.5)

## 2023-05-14 LAB — LIPID PANEL
Cholesterol: 272 mg/dL — ABNORMAL HIGH (ref 0–200)
HDL: 66.3 mg/dL (ref >39.00)
LDL Cholesterol: 187 mg/dL — ABNORMAL HIGH (ref 0–99)
NonHDL: 205.23
Total CHOL/HDL Ratio: 4
Triglycerides: 90 mg/dL (ref 0.0–149.0)
VLDL: 18 mg/dL (ref 0.0–40.0)

## 2023-05-14 NOTE — Patient Instructions (Addendum)
Call Dr. Shon Baton for follow up of your shoulder.   Blood pressure borderline elevated.  Please check your readings at home, with new monitor and let me know those readings in the next 2 weeks.  Watch sodium in the diet, see information below on managing high blood pressure as well as handout of common foods that can have higher sodium and affect your blood pressure.  If blood pressure remains over 130 and the top number or above 80 on the lower number, let me know and we will adjust your medication.  If any concerns on labs I will let you know.  Thanks for coming in today.  Managing Your Hypertension Hypertension, also called high blood pressure, is when the force of the blood pressing against the walls of the arteries is too strong. Arteries are blood vessels that carry blood from your heart throughout your body. Hypertension forces the heart to work harder to pump blood and may cause the arteries to become narrow or stiff. Understanding blood pressure readings A blood pressure reading includes a higher number over a lower number: The first, or top, number is called the systolic pressure. It is a measure of the pressure in your arteries as your heart beats. The second, or bottom number, is called the diastolic pressure. It is a measure of the pressure in your arteries as the heart relaxes. For most people, a normal blood pressure is below 120/80. Your personal target blood pressure may vary depending on your medical conditions, your age, and other factors. Blood pressure is classified into four stages. Based on your blood pressure reading, your health care provider may use the following stages to determine what type of treatment you need, if any. Systolic pressure and diastolic pressure are measured in a unit called millimeters of mercury (mmHg). Normal Systolic pressure: below 120. Diastolic pressure: below 80. Elevated Systolic pressure: 120-129. Diastolic pressure: below 80. Hypertension  stage 1 Systolic pressure: 130-139. Diastolic pressure: 80-89. Hypertension stage 2 Systolic pressure: 140 or above. Diastolic pressure: 90 or above. How can this condition affect me? Managing your hypertension is very important. Over time, hypertension can damage the arteries and decrease blood flow to parts of the body, including the brain, heart, and kidneys. Having untreated or uncontrolled hypertension can lead to: A heart attack. A stroke. A weakened blood vessel (aneurysm). Heart failure. Kidney damage. Eye damage. Memory and concentration problems. Vascular dementia. What actions can I take to manage this condition? Hypertension can be managed by making lifestyle changes and possibly by taking medicines. Your health care provider will help you make a plan to bring your blood pressure within a normal range. You may be referred for counseling on a healthy diet and physical activity. Nutrition  Eat a diet that is high in fiber and potassium, and low in salt (sodium), added sugar, and fat. An example eating plan is called the DASH diet. DASH stands for Dietary Approaches to Stop Hypertension. To eat this way: Eat plenty of fresh fruits and vegetables. Try to fill one-half of your plate at each meal with fruits and vegetables. Eat whole grains, such as whole-wheat pasta, brown rice, or whole-grain bread. Fill about one-fourth of your plate with whole grains. Eat low-fat dairy products. Avoid fatty cuts of meat, processed or cured meats, and poultry with skin. Fill about one-fourth of your plate with lean proteins such as fish, chicken without skin, beans, eggs, and tofu. Avoid pre-made and processed foods. These tend to be higher in sodium, added  sugar, and fat. Reduce your daily sodium intake. Many people with hypertension should eat less than 1,500 mg of sodium a day. Lifestyle  Work with your health care provider to maintain a healthy body weight or to lose weight. Ask what an  ideal weight is for you. Get at least 30 minutes of exercise that causes your heart to beat faster (aerobic exercise) most days of the week. Activities may include walking, swimming, or biking. Include exercise to strengthen your muscles (resistance exercise), such as weight lifting, as part of your weekly exercise routine. Try to do these types of exercises for 30 minutes at least 3 days a week. Do not use any products that contain nicotine or tobacco. These products include cigarettes, chewing tobacco, and vaping devices, such as e-cigarettes. If you need help quitting, ask your health care provider. Control any long-term (chronic) conditions you have, such as high cholesterol or diabetes. Identify your sources of stress and find ways to manage stress. This may include meditation, deep breathing, or making time for fun activities. Alcohol use Do not drink alcohol if: Your health care provider tells you not to drink. You are pregnant, may be pregnant, or are planning to become pregnant. If you drink alcohol: Limit how much you have to: 0-1 drink a day for women. 0-2 drinks a day for men. Know how much alcohol is in your drink. In the [place] one drink equals one 12 oz bottle of beer (355 mL), one 5 oz glass of wine (148 mL), or one 1 oz glass of hard liquor (44 mL). Medicines Your health care provider may prescribe medicine if lifestyle changes are not enough to get your blood pressure under control and if: Your systolic blood pressure is 130 or higher. Your diastolic blood pressure is 80 or higher. Take medicines only as told by your health care provider. Follow the directions carefully. Blood pressure medicines must be taken as told by your health care provider. The medicine does not work as well when you skip doses. Skipping doses also puts you at risk for problems. Monitoring Before you monitor your blood pressure: Do not smoke, drink caffeinated beverages, or exercise within 30 minutes  before taking a measurement. Use the bathroom and empty your bladder (urinate). Sit quietly for at least 5 minutes before taking measurements. Monitor your blood pressure at home as told by your health care provider. To do this: Sit with your back straight and supported. Place your feet flat on the floor. Do not cross your legs. Support your arm on a flat surface, such as a table. Make sure your upper arm is at heart level. Each time you measure, take two or three readings one minute apart and record the results. You may also need to have your blood pressure checked regularly by your health care provider. General information Talk with your health care provider about your diet, exercise habits, and other lifestyle factors that may be contributing to hypertension. Review all the medicines you take with your health care provider because there may be side effects or interactions. Keep all follow-up visits. Your health care provider can help you create and adjust your plan for managing your high blood pressure. Where to find more information National Heart, Lung, and Blood Institute: [employer]is American Heart Association: www.heart.org Contact a health care provider if: You think you are having a reaction to medicines you have taken. You have repeated (recurrent) headaches. You feel dizzy. You have swelling in your ankles. You have trouble with  your vision. Get help right away if: You develop a severe headache or confusion. You have unusual weakness or numbness, or you feel faint. You have severe pain in your chest or abdomen. You vomit repeatedly. You have trouble breathing. These symptoms may be an emergency. Get help right away. Call 911. Do not wait to see if the symptoms will go away. Do not drive yourself to the hospital. Summary Hypertension is when the force of blood pumping through your arteries is too strong. If this condition is not controlled, it may put you at risk for  serious complications. Your personal target blood pressure may vary depending on your medical conditions, your age, and other factors. For most people, a normal blood pressure is less than 120/80. Hypertension is managed by lifestyle changes, medicines, or both. Lifestyle changes to help manage hypertension include losing weight, eating a healthy, low-sodium diet, exercising more, stopping smoking, and limiting alcohol. This information is not intended to replace advice given to you by your health care provider. Make sure you discuss any questions you have with your health care provider. Document Revised: 03/31/2021 Document Reviewed: 03/31/2021 Elsevier Patient Education  2024 [employer].

## 2023-05-14 NOTE — Progress Notes (Unsigned)
Subjective:  Patient ID: Carrie Greer, female    DOB: 09-11-1970  Age: 52 y.o. MRN: [id number]  CC:  Chief Complaint  Patient presents with   Medical Management of Chronic Issues    Pt is here today to F/U Pt reports she has no concerns with this medication. Pt reports she takes her pill in at 12 pm. Pt report she has lower reading in am and 120s -80s after take around 6-7 pm. Pt reports she has seen Ortho in past 09/2022 for left shoulder , she had shots. This is still causing pain with movement Pt checked B/P with her cuff in office and had reading of 190/120     HPI Carrie Greer presents for    Hypertension: Hypokalemia, BP treated with lisinopril HCTZ 20/25 mg daily, amlodipine 5mg  every day. Takes at 12pm.  Potassium 20 mEq daily. Home readings: 124/81 range at home.  Monitor read high in office as above. Plans on purchasing new meter.   BP Readings from Last 3 Encounters:  05/14/23 (!) 144/82  04/12/23 136/80  12/18/22 (!) 138/90   Lab Results  Component Value Date   CREATININE 0.97 12/18/2022    Prediabetes: Weight has increased since last visit.  At that time she was exercising 30 minutes/day and was avoiding sugary beverages, had lost 5 pounds. Some exercise - 30 mins per day few times per week. Has avoided sugar beverages, but more starches. Home weight 177.   Lab Results  Component Value Date   HGBA1C 6.1 12/18/2022   Wt Readings from Last 3 Encounters:  05/14/23 183 lb 8 oz (83.2 kg)  12/18/22 168 lb 9.6 oz (76.5 kg)  08/28/22 173 lb (78.5 kg)   Followed by orthopedics for shoulder pain.  Saw Dr. Shon Baton in March.  Injection at that time with home exercise program.  Possible mild arthritic change and possible loose body. Some help with injection temporarily. 1 month follow-up planned. Has not seen due to nephew passed and aunt passed in June and August. She is managing ok with those losses.  Still having some left shoulder pain - more stiff  in the morning and sore with cooler air.   Hyperlipidemia: No current meds/statin. Weight changes as above.  The 10-year ASCVD risk score (Arnett DK, et al., 2019) is: 7.5%   Values used to calculate the score:     Age: 88 years     Sex: Female     Is Non-Hispanic African American: Yes     Diabetic: No     Tobacco smoker: No     Systolic Blood Pressure: 144 mmHg     Is BP treated: Yes     HDL Cholesterol: 55.2 mg/dL     Total Cholesterol: 231 mg/dL  Lab Results  Component Value Date   CHOL 231 (H) 08/28/2022   HDL 55.20 08/28/2022   LDLCALC 137 (H) 08/28/2022   TRIG 198.0 (H) 08/28/2022   CHOLHDL 4 08/28/2022   Lab Results  Component Value Date   ALT 12 08/28/2022   AST 14 08/28/2022   ALKPHOS 54 08/28/2022   BILITOT 0.5 08/28/2022      HM: Flu vaccine today.   History Patient Active Problem List   Diagnosis Date Noted   Essential hypertension 06/24/2014   Past Medical History:  Diagnosis Date   Gestational diabetes 2009   Hypertension    Past Surgical History:  Procedure Laterality Date   CESAREAN SECTION  2009   triplets  TUBAL LIGATION  2009   No Known Allergies Prior to Admission medications   Medication Sig Start Date End Date Taking? Authorizing Provider  amLODipine (NORVASC) 5 MG tablet Take 0.5-1 tablets (2.5-5 mg total) by mouth daily. 12/18/22  Yes Shade Flood, MD  ibuprofen (ADVIL) 600 MG tablet Take 1 tablet (600 mg total) by mouth every 6 (six) hours as needed. 09/28/22  Yes Ellsworth Lennox, PA-C  lisinopril-hydrochlorothiazide (ZESTORETIC) 20-25 MG tablet TAKE 1 TABLET BY MOUTH ONCE DAILY 12/18/22  Yes Shade Flood, MD  potassium chloride SA (KLOR-CON M) 20 MEQ tablet Take 1 tablet (20 mEq total) by mouth daily. 12/18/22  Yes Shade Flood, MD  tiZANidine (ZANAFLEX) 4 MG tablet Take 1 tablet (4 mg total) by mouth every 6 (six) hours as needed for muscle spasms. Patient not taking: Reported on 05/14/2023 09/28/22   Ellsworth Lennox, PA-C    Social History   Socioeconomic History   Marital status: Married    Spouse name: Ronnald Nian   Number of children: 7   Years of education: college   Highest education level: Not on file  Occupational History   Occupation: Childcare    Comment: Medical illustrator  Tobacco Use   Smoking status: Never   Smokeless tobacco: Never  Vaping Use   Vaping status: Never Used  Substance and Sexual Activity   Alcohol use: Yes    Alcohol/week: 0.0 - 3.0 standard drinks of alcohol    Comment: wine on the weekends, reports not weekly   Drug use: Never   Sexual activity: Yes    Partners: Male    Birth control/protection: Surgical, Post-menopausal    Comment: BTL 2009 after the birth of triplets, sexually active with monogamous partner  Other Topics Concern   Not on file  Social History Narrative   Pt is from [place]. Has been in [place] since 2005.   Just graduated in 11/2016 at [employer] with degree in Early Childhood Development.Continuing on now for BA at [employer].   Lives with her husband and their 4 youngest children (triplets and a 55 year old). Has 3 other grown children. (7 children total).       Exercise includes walking, stair climbing, playing with the children.       Diet includes baked chicken, fish, potatoes, salads and greens. Likes water and minute maid OJ. Gets enough dairy through milk, cheese, and yogurt.       Social Determinants of Health   Financial Resource Strain: Not on file  Food Insecurity: Not on file  Transportation Needs: Not on file  Physical Activity: Not on file  Stress: Not on file  Social Connections: Not on file  Intimate Partner Violence: Not on file    Review of Systems  Constitutional:  Negative for fatigue and unexpected weight change.  Respiratory:  Negative for chest tightness and shortness of breath.   Cardiovascular:  Negative for chest pain, palpitations and leg swelling.  Gastrointestinal:  Negative for abdominal pain and  blood in stool.  Neurological:  Negative for dizziness, syncope, light-headedness and headaches.     Objective:   Vitals:   05/14/23 0955 05/14/23 1030  BP: (!) 144/82 (!) 152/100  Pulse: 94   Temp: 98.4 F (36.9 C)   SpO2: 96%   Weight: 183 lb 8 oz (83.2 kg)   Height: 5\' 5"  (1.651 m)      Physical Exam Vitals reviewed.  Constitutional:      Appearance: Normal appearance. She is well-developed.  HENT:     Head: Normocephalic and atraumatic.  Eyes:     Conjunctiva/sclera: Conjunctivae normal.     Pupils: Pupils are equal, round, and reactive to light.  Neck:     Vascular: No carotid bruit.  Cardiovascular:     Rate and Rhythm: Normal rate and regular rhythm.     Heart sounds: Normal heart sounds.  Pulmonary:     Effort: Pulmonary effort is normal.     Breath sounds: Normal breath sounds.  Abdominal:     Palpations: Abdomen is soft. There is no pulsatile mass.     Tenderness: There is no abdominal tenderness.  Musculoskeletal:     Right lower leg: No edema.     Left lower leg: No edema.  Skin:    General: Skin is warm and dry.  Neurological:     Mental Status: She is alert and oriented to person, place, and time.  Psychiatric:        Mood and Affect: Mood normal.        Behavior: Behavior normal.        Assessment & Plan:  Tulip Meharg is a 53 y.o. female . Essential hypertension  - .  Borderline control with elevation in office.  Lower readings at home.  Question accuracy of home monitor.  Tolerating current med regimen.  Plan for home monitoring with new blood pressure meter over the next 2 weeks and if above 130/80 will add additional medication.  Handout given on management of hypertension as well as handout on high salt foods that may worsen control.  Prediabetes - Plan: Hemoglobin A1c  -Continue watch diet, exercise, check labs and adjust plan accordingly.  Hypokalemia - Plan: Comprehensive metabolic panel  -History of hypokalemia, check  updated labs and adjust plan accordingly.  Hyperlipidemia, unspecified hyperlipidemia type - Plan: Comprehensive metabolic panel, Lipid panel  -No current statin with borderline ASCVD risk score previously.  Check labs and adjust plan accordingly.  Needs flu shot - Plan: Flu vaccine trivalent PF, 6mos and older(Flulaval,Afluria,Fluarix,Fluzone)   No orders of the defined types were placed in this encounter.  Patient Instructions  Call Dr. Shon Baton for follow up of your shoulder.   Blood pressure borderline elevated.  Please check your readings at home, with new monitor and let me know those readings in the next 2 weeks.  Watch sodium in the diet, see information below on managing high blood pressure as well as handout of common foods that can have higher sodium and affect your blood pressure.  If blood pressure remains over 130 and the top number or above 80 on the lower number, let me know and we will adjust your medication.  If any concerns on labs I will let you know.  Thanks for coming in today.  Managing Your Hypertension Hypertension, also called high blood pressure, is when the force of the blood pressing against the walls of the arteries is too strong. Arteries are blood vessels that carry blood from your heart throughout your body. Hypertension forces the heart to work harder to pump blood and may cause the arteries to become narrow or stiff. Understanding blood pressure readings A blood pressure reading includes a higher number over a lower number: The first, or top, number is called the systolic pressure. It is a measure of the pressure in your arteries as your heart beats. The second, or bottom number, is called the diastolic pressure. It is a measure of the pressure in your arteries as the  heart relaxes. For most people, a normal blood pressure is below 120/80. Your personal target blood pressure may vary depending on your medical conditions, your age, and other factors. Blood  pressure is classified into four stages. Based on your blood pressure reading, your health care provider may use the following stages to determine what type of treatment you need, if any. Systolic pressure and diastolic pressure are measured in a unit called millimeters of mercury (mmHg). Normal Systolic pressure: below 120. Diastolic pressure: below 80. Elevated Systolic pressure: 120-129. Diastolic pressure: below 80. Hypertension stage 1 Systolic pressure: 130-139. Diastolic pressure: 80-89. Hypertension stage 2 Systolic pressure: 140 or above. Diastolic pressure: 90 or above. How can this condition affect me? Managing your hypertension is very important. Over time, hypertension can damage the arteries and decrease blood flow to parts of the body, including the brain, heart, and kidneys. Having untreated or uncontrolled hypertension can lead to: A heart attack. A stroke. A weakened blood vessel (aneurysm). Heart failure. Kidney damage. Eye damage. Memory and concentration problems. Vascular dementia. What actions can I take to manage this condition? Hypertension can be managed by making lifestyle changes and possibly by taking medicines. Your health care provider will help you make a plan to bring your blood pressure within a normal range. You may be referred for counseling on a healthy diet and physical activity. Nutrition  Eat a diet that is high in fiber and potassium, and low in salt (sodium), added sugar, and fat. An example eating plan is called the DASH diet. DASH stands for Dietary Approaches to Stop Hypertension. To eat this way: Eat plenty of fresh fruits and vegetables. Try to fill one-half of your plate at each meal with fruits and vegetables. Eat whole grains, such as whole-wheat pasta, brown rice, or whole-grain bread. Fill about one-fourth of your plate with whole grains. Eat low-fat dairy products. Avoid fatty cuts of meat, processed or cured meats, and poultry with  skin. Fill about one-fourth of your plate with lean proteins such as fish, chicken without skin, beans, eggs, and tofu. Avoid pre-made and processed foods. These tend to be higher in sodium, added sugar, and fat. Reduce your daily sodium intake. Many people with hypertension should eat less than 1,500 mg of sodium a day. Lifestyle  Work with your health care provider to maintain a healthy body weight or to lose weight. Ask what an ideal weight is for you. Get at least 30 minutes of exercise that causes your heart to beat faster (aerobic exercise) most days of the week. Activities may include walking, swimming, or biking. Include exercise to strengthen your muscles (resistance exercise), such as weight lifting, as part of your weekly exercise routine. Try to do these types of exercises for 30 minutes at least 3 days a week. Do not use any products that contain nicotine or tobacco. These products include cigarettes, chewing tobacco, and vaping devices, such as e-cigarettes. If you need help quitting, ask your health care provider. Control any long-term (chronic) conditions you have, such as high cholesterol or diabetes. Identify your sources of stress and find ways to manage stress. This may include meditation, deep breathing, or making time for fun activities. Alcohol use Do not drink alcohol if: Your health care provider tells you not to drink. You are pregnant, may be pregnant, or are planning to become pregnant. If you drink alcohol: Limit how much you have to: 0-1 drink a day for women. 0-2 drinks a day for men. Know how much  alcohol is in your drink. In the [place] one drink equals one 12 oz bottle of beer (355 mL), one 5 oz glass of wine (148 mL), or one 1 oz glass of hard liquor (44 mL). Medicines Your health care provider may prescribe medicine if lifestyle changes are not enough to get your blood pressure under control and if: Your systolic blood pressure is 130 or higher. Your  diastolic blood pressure is 80 or higher. Take medicines only as told by your health care provider. Follow the directions carefully. Blood pressure medicines must be taken as told by your health care provider. The medicine does not work as well when you skip doses. Skipping doses also puts you at risk for problems. Monitoring Before you monitor your blood pressure: Do not smoke, drink caffeinated beverages, or exercise within 30 minutes before taking a measurement. Use the bathroom and empty your bladder (urinate). Sit quietly for at least 5 minutes before taking measurements. Monitor your blood pressure at home as told by your health care provider. To do this: Sit with your back straight and supported. Place your feet flat on the floor. Do not cross your legs. Support your arm on a flat surface, such as a table. Make sure your upper arm is at heart level. Each time you measure, take two or three readings one minute apart and record the results. You may also need to have your blood pressure checked regularly by your health care provider. General information Talk with your health care provider about your diet, exercise habits, and other lifestyle factors that may be contributing to hypertension. Review all the medicines you take with your health care provider because there may be side effects or interactions. Keep all follow-up visits. Your health care provider can help you create and adjust your plan for managing your high blood pressure. Where to find more information National Heart, Lung, and Blood Institute: PopSteam.is American Heart Association: www.heart.org Contact a health care provider if: You think you are having a reaction to medicines you have taken. You have repeated (recurrent) headaches. You feel dizzy. You have swelling in your ankles. You have trouble with your vision. Get help right away if: You develop a severe headache or confusion. You have unusual weakness or  numbness, or you feel faint. You have severe pain in your chest or abdomen. You vomit repeatedly. You have trouble breathing. These symptoms may be an emergency. Get help right away. Call 911. Do not wait to see if the symptoms will go away. Do not drive yourself to the hospital. Summary Hypertension is when the force of blood pumping through your arteries is too strong. If this condition is not controlled, it may put you at risk for serious complications. Your personal target blood pressure may vary depending on your medical conditions, your age, and other factors. For most people, a normal blood pressure is less than 120/80. Hypertension is managed by lifestyle changes, medicines, or both. Lifestyle changes to help manage hypertension include losing weight, eating a healthy, low-sodium diet, exercising more, stopping smoking, and limiting alcohol. This information is not intended to replace advice given to you by your health care provider. Make sure you discuss any questions you have with your health care provider. Document Revised: 03/31/2021 Document Reviewed: 03/31/2021 Elsevier Patient Education  2024 Elsevier Inc.     Signed,   Meredith Staggers, MD [facility], [facility] 05/14/23 10:11 AM

## 2023-05-15 ENCOUNTER — Encounter: Payer: Self-pay | Admitting: Family Medicine

## 2023-05-15 ENCOUNTER — Other Ambulatory Visit: Payer: Self-pay | Admitting: Family Medicine

## 2023-05-15 DIAGNOSIS — E876 Hypokalemia: Secondary | ICD-10-CM

## 2023-05-15 NOTE — Progress Notes (Signed)
See lab notes

## 2023-05-21 ENCOUNTER — Other Ambulatory Visit: Payer: Self-pay | Admitting: Radiology

## 2023-05-21 DIAGNOSIS — E876 Hypokalemia: Secondary | ICD-10-CM

## 2023-05-21 MED ORDER — POTASSIUM CHLORIDE CRYS ER 20 MEQ PO TBCR
20.0000 meq | EXTENDED_RELEASE_TABLET | Freq: Every day | ORAL | 1 refills | Status: AC
Start: 2023-05-21 — End: ?

## 2023-05-22 ENCOUNTER — Other Ambulatory Visit: Payer: Self-pay | Admitting: Family Medicine

## 2023-05-22 NOTE — Progress Notes (Signed)
Patient requesting potassium refill when discussing lab results  - sent yesterday.

## 2023-06-08 ENCOUNTER — Ambulatory Visit (INDEPENDENT_AMBULATORY_CARE_PROVIDER_SITE_OTHER): Payer: 59 | Admitting: Sports Medicine

## 2023-06-08 DIAGNOSIS — M25512 Pain in left shoulder: Secondary | ICD-10-CM

## 2023-06-08 DIAGNOSIS — M19012 Primary osteoarthritis, left shoulder: Secondary | ICD-10-CM

## 2023-06-08 DIAGNOSIS — M24012 Loose body in left shoulder: Secondary | ICD-10-CM

## 2023-06-08 DIAGNOSIS — G8929 Other chronic pain: Secondary | ICD-10-CM

## 2023-06-08 NOTE — Progress Notes (Unsigned)
   Carrie Greer - 52 y.o. female MRN [id number]  Date of birth: May 01, 1971  Office Visit Note: Visit Date: 06/08/2023 PCP: Shade Flood, MD Referred by: Shade Flood, MD  Subjective: Chief Complaint  Patient presents with  . Left Shoulder - Follow-up   HPI: Carrie Greer is a pleasant 52 y.o. female who presents today for follow-up of left shoulder pain.    Pertinent ROS were reviewed with the patient and found to be negative unless otherwise specified above in HPI.   Assessment & Plan: Visit Diagnoses: No diagnosis found.  Plan: ***  Follow-up: No follow-ups on file.   Meds & Orders: No orders of the defined types were placed in this encounter.  No orders of the defined types were placed in this encounter.    Procedures: No procedures performed      Clinical History: No specialty comments available.  She reports that she has never smoked. She has never used smokeless tobacco.  Recent Labs    08/28/22 1634 12/18/22 1204 05/14/23 1043  HGBA1C 6.6* 6.1 6.5    Objective:   Vital Signs: LMP 04/30/2018 (Exact Date)   Physical Exam  Gen: Well-appearing, in no acute distress; non-toxic CV: Regular Rate. Well-perfused. Warm.  Resp: Breathing unlabored on room air; no wheezing. Psych: Fluid speech in conversation; appropriate affect; normal thought process Neuro: Sensation intact throughout. No gross coordination deficits.   Ortho Exam - ***  Imaging: No results found.  Past Medical/Family/Surgical/Social History: Medications & Allergies reviewed per EMR, new medications updated. Patient Active Problem List   Diagnosis Date Noted  . Essential hypertension 06/24/2014   Past Medical History:  Diagnosis Date  . Gestational diabetes 2009  . Hypertension    Family History  Problem Relation Age of Onset  . Cancer Mother 34       2005; removed a golf-ball sized tumor from behind her LEFT ear  . Diabetes Mother        lifestyle  controlled  . Hypertension Mother   . Hypertension Father   . Colon cancer Neg Hx   . Colon polyps Neg Hx   . Esophageal cancer Neg Hx   . Stomach cancer Neg Hx   . Rectal cancer Neg Hx    Past Surgical History:  Procedure Laterality Date  . CESAREAN SECTION  2009   triplets  . TUBAL LIGATION  2009   Social History   Occupational History  . Occupation: Childcare    Comment: Medical illustrator  Tobacco Use  . Smoking status: Never  . Smokeless tobacco: Never  Vaping Use  . Vaping status: Never Used  Substance and Sexual Activity  . Alcohol use: Yes    Alcohol/week: 0.0 - 3.0 standard drinks of alcohol    Comment: wine on the weekends, reports not weekly  . Drug use: Never  . Sexual activity: Yes    Partners: Male    Birth control/protection: Surgical, Post-menopausal    Comment: BTL 2009 after the birth of triplets, sexually active with monogamous partner

## 2023-06-08 NOTE — Progress Notes (Unsigned)
Patient says that her shoulder is still bothering her. If she reaches with the left arm she gets a sharp pain, and she says that if she reacts too quickly the shoulder and upper arm will feel numb. She says that she got a couple of months of relief from the injection. Patient says that she still does her home exercises.

## 2023-06-09 ENCOUNTER — Encounter: Payer: Self-pay | Admitting: Sports Medicine

## 2023-06-12 ENCOUNTER — Encounter: Payer: Self-pay | Admitting: Sports Medicine

## 2023-06-14 ENCOUNTER — Ambulatory Visit (INDEPENDENT_AMBULATORY_CARE_PROVIDER_SITE_OTHER): Payer: 59

## 2023-06-14 ENCOUNTER — Ambulatory Visit
Admission: EM | Admit: 2023-06-14 | Discharge: 2023-06-14 | Disposition: A | Discharge status: Home / Self Care | Payer: 59 | Attending: Emergency Medicine | Admitting: Emergency Medicine

## 2023-06-14 ENCOUNTER — Encounter: Payer: Self-pay | Admitting: Sports Medicine

## 2023-06-14 DIAGNOSIS — I10 Essential (primary) hypertension: Secondary | ICD-10-CM | POA: Diagnosis not present

## 2023-06-14 DIAGNOSIS — T503X5A Adverse effect of electrolytic, caloric and water-balance agents, initial encounter: Secondary | ICD-10-CM | POA: Diagnosis not present

## 2023-06-14 DIAGNOSIS — K208 Other esophagitis without bleeding: Secondary | ICD-10-CM

## 2023-06-14 DIAGNOSIS — Z03821 Encounter for observation for suspected ingested foreign body ruled out: Secondary | ICD-10-CM | POA: Diagnosis not present

## 2023-06-14 MED ORDER — LIDOCAINE VISCOUS HCL 2 % MT SOLN
15.0000 mL | Freq: Once | OROMUCOSAL | Status: AC
  Administered 2023-06-14: 15 mL via OROMUCOSAL

## 2023-06-14 MED ORDER — OMEPRAZOLE 20 MG PO CPDR: 20.0000 mg | DELAYED_RELEASE_CAPSULE | Freq: Every day | ORAL | 0 refills | Status: AC

## 2023-06-14 MED ORDER — ALUM & MAG HYDROXIDE-SIMETH 200-200-20 MG/5ML PO SUSP
30.0000 mL | Freq: Once | ORAL | Status: AC
  Administered 2023-06-14: 30 mL via ORAL

## 2023-06-14 MED ORDER — ALUM & MAG HYDROXIDE-SIMETH 200-200-20 MG/5ML PO SUSP: 30.0000 mL | Freq: Once | ORAL | Status: DC

## 2023-06-14 NOTE — ED Provider Notes (Signed)
HPI  SUBJECTIVE:  Carrie Greer is a 52 y.o. female who presents with a sensation of a potassium pill "being stuck" in her upper chest that is "slowly moving down" starting 2 days ago.  She states that the potassium is a large pill.  She denies chest pain, shortness of breath, fevers, sore throat.  She can eat normally and swallow solids and liquids without pain, but is taking smaller bites.  No drooling, trismus, neck stiffness, abdominal pain, voice changes.  She has tried eating Jell-O, drinking Candida dry, and taking small bites of her food.  Symptoms are better when she passes gas and with belching.  She states that belching makes it move distally.  No aggravating factors.  She has a past medical history of hypertension which she states normally runs 130s/70s.  She took her medications today.  She states that it is elevated here because she is anxious.  She no history of GERD, esophageal varices.  PCP: [facility].  GI: None.   Past Medical History:  Diagnosis Date   Gestational diabetes 2009   Hypertension     Past Surgical History:  Procedure Laterality Date   CESAREAN SECTION  2009   triplets   TUBAL LIGATION  2009    Family History  Problem Relation Age of Onset   Cancer Mother 72       55; removed a golf-ball sized tumor from behind her LEFT ear   Diabetes Mother        lifestyle controlled   Hypertension Mother    Hypertension Father    Colon cancer Neg Hx    Colon polyps Neg Hx    Esophageal cancer Neg Hx    Stomach cancer Neg Hx    Rectal cancer Neg Hx     Social History   Tobacco Use   Smoking status: Never   Smokeless tobacco: Never  Vaping Use   Vaping status: Never Used  Substance Use Topics   Alcohol use: Yes    Alcohol/week: 0.0 - 3.0 standard drinks of alcohol    Comment: wine on the weekends, reports not weekly   Drug use: Never    No current facility-administered medications for this encounter.  Current Outpatient  Medications:    amLODipine (NORVASC) 5 MG tablet, Take 0.5-1 tablets (2.5-5 mg total) by mouth daily. (Patient taking differently: Take 5 mg by mouth daily.), Disp: 90 tablet, Rfl: 1   lisinopril-hydrochlorothiazide (ZESTORETIC) 20-25 MG tablet, TAKE 1 TABLET BY MOUTH ONCE DAILY, Disp: 90 tablet, Rfl: 1   omeprazole (PRILOSEC) 20 MG capsule, Take 1 capsule (20 mg total) by mouth daily for 14 days., Disp: 14 capsule, Rfl: 0   potassium chloride SA (KLOR-CON M) 20 MEQ tablet, Take 1 tablet (20 mEq total) by mouth daily., Disp: 90 tablet, Rfl: 1  No Known Allergies   ROS  As noted in HPI.   Physical Exam  BP (!) 177/97 (BP Location: Left Arm) Comment: "I am very anxious about this today and we can check it later". Hx of HTN.  Pulse 88   Temp 97.7 F (36.5 C) (Oral)   Resp 18   Ht 5\' 5"  (1.651 m)   Wt 80.7 kg   LMP 04/30/2018 (Exact Date)   SpO2 99%   BMI 29.62 kg/m   Constitutional: Well developed, well nourished, no acute distress Eyes:  EOMI, conjunctiva normal bilaterally HENT: Normocephalic, atraumatic,mucus membranes moist.  Airway widely patent.  Was able to visualize deep into the oropharynx,  no foreign body seen.  No drooling, trismus.  Normal voice. Respiratory: Normal inspiratory effort, lungs clear bilaterally. Cardiovascular: Regular rate and rhythm, no murmurs rubs or gallops.  Mild tenderness to the upper sternum. GI: nondistended skin: No rash, skin intact Musculoskeletal: no deformities Neurologic: Alert & oriented x 3, no focal neuro deficits Psychiatric: Speech and behavior appropriate   ED Course   Medications  alum & mag hydroxide-simeth (MAALOX/MYLANTA) 200-200-20 MG/5ML suspension 30 mL (30 mLs Oral Given 06/14/23 1618)  lidocaine (XYLOCAINE) 2 % viscous mouth solution 15 mL (15 mLs Mouth/Throat Given 06/14/23 1617)    Orders Placed This Encounter  Procedures   DG Chest 2 View    Standing Status:   Standing    Number of Occurrences:   1    Order  Specific Question:   Reason for Exam (SYMPTOM  OR DIAGNOSIS REQUIRED)    Answer:   Swallowed large potassium pill, rule out retained foreign body in esophagus or in lungs   Ambulatory referral to Gastroenterology    Referral Priority:   Urgent    Referral Type:   Consultation    Referral Reason:   Specialty Services Required    Number of Visits Requested:   1   Recheck vitals    Standing Status:   Standing    Number of Occurrences:   1    No results found for this or any previous visit (from the past 24 hour(s)). DG Chest 2 View  Result Date: 06/14/2023 CLINICAL DATA:  Swallowed large potassium pill. Evaluate for foreign body. EXAM: CHEST - 2 VIEW COMPARISON:  Chest x-ray 04/14/2014 FINDINGS: The heart size and mediastinal contours are within normal limits. No foreign body identified. Both lungs are clear. The visualized skeletal structures are unremarkable. IMPRESSION: No active cardiopulmonary disease. No foreign body identified. Electronically Signed   By: Darliss Cheney M.D.   On: 06/14/2023 19:57    ED Clinical Impression  1. Pill esophagitis due to potassium chloride   2. Elevated blood pressure reading with diagnosis of hypertension      ED Assessment/Plan    1.  Possible esophageal foreign body.  Suspect pill esophagitis.  Patient denies hematemesis, abdominal pain.  I do not think that there is a complete obstruction at this time.  Will check a chest x-ray to see if we can localize it.  Will have her discontinue the potassium for now, and start omeprazole 20 mg daily per up-to-date recommendations for 2 weeks.  Will refer to GI if symptoms persist past a week for for endoscopy.  Reviewed imaging independently.  No consolidation, infiltrate, foreign body seen.  Discussed with patient that formal radiology over read is pending and that we will contact her if it comes back abnormal.  On reevaluation, she states that she feels better after the GI cocktail.  Reviewed radiology  report.  No foreign body seen.  see radiology report for full details.  2.  Elevated blood pressure reading with diagnosis of hypertension.  Patient states that she has been anxious over the past few days.  She states that she is compliant with her medications.  She is otherwise asymptomatic.  She will monitor this at home.  Giving patient hypertensive emergency department return precautions.  Discussed imaging, MDM, treatment plan, and plan for follow-up with patient. Discussed sn/sx that should prompt return to the ED. patient agrees with plan.   Meds ordered this encounter  Medications   DISCONTD: alum & mag hydroxide-simeth (MAALOX/MYLANTA) 200-200-20 MG/5ML suspension  30 mL   alum & mag hydroxide-simeth (MAALOX/MYLANTA) 200-200-20 MG/5ML suspension 30 mL   lidocaine (XYLOCAINE) 2 % viscous mouth solution 15 mL   omeprazole (PRILOSEC) 20 MG capsule    Sig: Take 1 capsule (20 mg total) by mouth daily for 14 days.    Dispense:  14 capsule    Refill:  0      *This clinic note was created using Scientist, clinical (histocompatibility and immunogenetics). Therefore, there may be occasional mistakes despite careful proofreading.  ?    Domenick Gong, MD 06/16/23 1137

## 2023-06-14 NOTE — ED Triage Notes (Signed)
"  I feel like something did not go down correctly or may be stuck in my esophagus". No difficulty swallowing. No sob. No respiratory distress. "I think it was my Potassium pills because they are large". No nausea. No vomiting.

## 2023-06-14 NOTE — Discharge Instructions (Addendum)
Stop the potassium for now.  Start the omeprazole and take it until your GI tells you to stop.  I have put in an urgent referral to GI, but you can call them and arrange an appointment if your symptoms are still persisting in 5 days.  Go to the ER for chest pain, shortness of breath, fevers, coughing, wheezing, vomiting blood, drooling, or for any other concerns.  You can take 5 mL of children's liquid Benadryl mixed with 5 mL of Maalox to help soothe your esophagus 3-4 times a day.  Keep a close eye on your blood pressure at home.  It is important to keep your blood pressure under good control, as having a elevated blood pressure for prolonged periods of time significantly increases your risk of stroke, heart attacks, kidney damage, eye damage, and other problems. Measure your blood pressure once a day, preferably at the same time every day. Keep a log of this and bring it to your next doctor's appointment.  Bring your blood pressure cuff as well.   Return immediately to the ER if you start having chest pain, headache, problems seeing, problems talking, problems walking, if you feel like you're about to pass out, if you do pass out, if you have a seizure, or for any other concerns.  Go to www.goodrx.com  or www.costplusdrugs.com to look up your medications. This will give you a list of where you can find your prescriptions at the most affordable prices. Or ask the pharmacist what the cash price is, or if they have any other discount programs available to help make your medication more affordable. This can be less expensive than what you would pay with insurance.

## 2023-06-25 ENCOUNTER — Other Ambulatory Visit: Payer: 59

## 2023-07-23 ENCOUNTER — Ambulatory Visit
Admission: RE | Admit: 2023-07-23 | Discharge: 2023-07-23 | Disposition: A | Discharge status: Home / Self Care | Payer: 59 | Source: Ambulatory Visit | Attending: Sports Medicine | Admitting: Sports Medicine

## 2023-07-23 DIAGNOSIS — M24012 Loose body in left shoulder: Secondary | ICD-10-CM | POA: Diagnosis not present

## 2023-07-23 DIAGNOSIS — M19012 Primary osteoarthritis, left shoulder: Secondary | ICD-10-CM

## 2023-07-23 DIAGNOSIS — G8929 Other chronic pain: Secondary | ICD-10-CM

## 2023-07-23 DIAGNOSIS — M25512 Pain in left shoulder: Secondary | ICD-10-CM | POA: Diagnosis not present

## 2023-07-23 MED ORDER — IOPAMIDOL (ISOVUE-M 200) INJECTION 41%
10.0000 mL | Freq: Once | INTRAMUSCULAR | Status: AC
  Administered 2023-07-23: 10 mL via INTRA_ARTICULAR

## 2023-08-15 ENCOUNTER — Ambulatory Visit: Payer: 59 | Admitting: Family Medicine

## 2023-08-22 ENCOUNTER — Ambulatory Visit: Payer: 59 | Admitting: Family Medicine

## 2023-08-30 ENCOUNTER — Encounter: Payer: Self-pay | Admitting: Family Medicine

## 2023-08-30 ENCOUNTER — Ambulatory Visit: Payer: 59 | Admitting: Family Medicine

## 2023-09-03 ENCOUNTER — Encounter: Payer: Self-pay | Admitting: Sports Medicine

## 2023-09-03 ENCOUNTER — Ambulatory Visit (INDEPENDENT_AMBULATORY_CARE_PROVIDER_SITE_OTHER): Payer: 59 | Admitting: Sports Medicine

## 2023-09-03 DIAGNOSIS — M7532 Calcific tendinitis of left shoulder: Secondary | ICD-10-CM | POA: Diagnosis not present

## 2023-09-03 DIAGNOSIS — M25512 Pain in left shoulder: Secondary | ICD-10-CM

## 2023-09-03 DIAGNOSIS — G8929 Other chronic pain: Secondary | ICD-10-CM | POA: Diagnosis not present

## 2023-09-03 NOTE — Progress Notes (Addendum)
 Carrie Greer - 53 y.o. female MRN [id number]  Date of birth: Jul 07, 1971  Office Visit Note: Visit Date: 09/03/2023 PCP: Shade Flood, MD Referred by: Shade Flood, MD  Subjective: Chief Complaint  Patient presents with   Left Shoulder - Follow-up   HPI: Carrie Greer is a pleasant 53 y.o. female who presents today for follow-up of left shoulder pain, MRI review.  She is left-hand dominant.  She continues with left shoulder pain, feels worse with weather changes and with certain reaching exercises.  Back in March of last year we did perform an ultrasound-guided glenohumeral injection which significantly relieved her pain but never went away fully.  She has done some of her home exercises but not formalized PT  Pertinent ROS were reviewed with the patient and found to be negative unless otherwise specified above in HPI.   Assessment & Plan: Visit Diagnoses:  1. Calcific tendinitis of left shoulder   2. Chronic left shoulder pain    Plan: Impression is chronic left shoulder pain with MRI x-ray findings of calcific tendinitis of the supraspinatus tendon.  She does have some reciprocal subacromial bursitis.  We discussed treatment options and she is open to both formalized PT as well as trialing an ultrasound-guided injection into the supraspinatus calcification in attempt to break up the calcium - she will think about this and She will schedule this at her leisure. Regardless I do want to see her in f/u as she starts PT to re-evaluate the shoulder and its response. Referral sent for formalized PT for the shoulder.  We did discuss this could be removed surgically with arthroscopy, but both are agreeable to hold on this for now and try the above first.  May use over-the-counter anti-inflammatories only as needed  Follow-up: Return in about 23 days (around 09/26/2023) for f/u in 3-4 weeks for US-guided calcific ten injection shoulder (30-min).   Meds & Orders: No  orders of the defined types were placed in this encounter.   Orders Placed This Encounter  Procedures   Ambulatory referral to Physical Therapy     Procedures: No procedures performed      Clinical History: No specialty comments available.  She reports that she has never smoked. She has never used smokeless tobacco.  Recent Labs    12/18/22 1204 05/14/23 1043  HGBA1C 6.1 6.5    Objective:   Vital Signs: LMP 04/30/2018 (Exact Date)   Physical Exam  Gen: Well-appearing, in no acute distress; non-toxic CV: Well-perfused. Warm.  Resp: Breathing unlabored on room air; no wheezing. Psych: Fluid speech in conversation; appropriate affect; normal thought process  Ortho Exam - Left shoulder: + TTP around Codman's point and over the intersection of the spinatus posteriorly.  There is 4 active and passive range of motion in all directions.  Positive empty can's impingement test.  Imaging:  MR Shoulder Left w/ contrast CLINICAL DATA:  Left shoulder pain, limited range of motion  EXAM: MRI OF THE LEFT SHOULDER WITH CONTRAST  TECHNIQUE: Multiplanar, multisequence MR imaging of the left shoulder was performed following the administration of intra-articular contrast.  CONTRAST:  See Injection Documentation.  COMPARISON:  None Available.  FINDINGS: Rotator cuff: Mild mineralization of the anterior aspect of the supraspinatus insertion consistent with mild calcific tendinosis. Mild infraspinatus tendinosis. Teres minor tendon is intact. Subscapularis tendon is intact.  Muscles: No muscle atrophy or edema. No intramuscular fluid collection or hematoma.  Biceps Long Head: Intraarticular and extraarticular portions of the biceps  tendon are intact.  Acromioclavicular Joint: Mild arthropathy of the acromioclavicular joint. Small amount of subacromial/subdeltoid bursal fluid.  Glenohumeral Joint: Intraarticular contrast distending the joint capsule. Normal glenohumeral  ligaments. No chondral defect.  Labrum: Intact.  Bones: No fracture or dislocation. No aggressive osseous lesion.  Other: No fluid collection or hematoma.  IMPRESSION: 1. Mild calcific tendinosis of the anterior aspect of the supraspinatus insertion. 2. Mild infraspinatus tendinosis. 3. Mild subacromial/subdeltoid bursitis.  Electronically Signed   By: Elige Ko M.D.   On: 08/04/2023 09:18  Past Medical/Family/Surgical/Social History: Medications & Allergies reviewed per EMR, new medications updated. Patient Active Problem List   Diagnosis Date Noted   Essential hypertension 06/24/2014   Past Medical History:  Diagnosis Date   Gestational diabetes 2009   Hypertension    Family History  Problem Relation Age of Onset   Cancer Mother 58       67; removed a golf-ball sized tumor from behind her LEFT ear   Diabetes Mother        lifestyle controlled   Hypertension Mother    Hypertension Father    Colon cancer Neg Hx    Colon polyps Neg Hx    Esophageal cancer Neg Hx    Stomach cancer Neg Hx    Rectal cancer Neg Hx    Past Surgical History:  Procedure Laterality Date   CESAREAN SECTION  2009   triplets   TUBAL LIGATION  2009   Social History   Occupational History   Occupation: Childcare    Comment: Medical illustrator  Tobacco Use   Smoking status: Never   Smokeless tobacco: Never  Vaping Use   Vaping status: Never Used  Substance and Sexual Activity   Alcohol use: Yes    Alcohol/week: 0.0 - 3.0 standard drinks of alcohol    Comment: wine on the weekends, reports not weekly   Drug use: Never   Sexual activity: Yes    Partners: Male    Birth control/protection: Surgical, Post-menopausal    Comment: BTL 2009 after the birth of triplets, sexually active with monogamous partner

## 2023-09-03 NOTE — Progress Notes (Signed)
Patient says that her shoulder is feeling about the same. It is stiff and sore and also worse when the weather changes. She does her home exercises when she is able to and says that those feel okay.

## 2023-09-11 ENCOUNTER — Ambulatory Visit: Payer: 59 | Admitting: Gastroenterology

## 2023-09-25 ENCOUNTER — Ambulatory Visit: Payer: 59 | Admitting: Sports Medicine

## 2023-10-08 ENCOUNTER — Ambulatory Visit: Attending: Sports Medicine | Admitting: Physical Therapy

## 2023-10-08 ENCOUNTER — Other Ambulatory Visit: Payer: Self-pay

## 2023-10-08 ENCOUNTER — Encounter: Payer: Self-pay | Admitting: Physical Therapy

## 2023-10-08 DIAGNOSIS — M7532 Calcific tendinitis of left shoulder: Secondary | ICD-10-CM | POA: Insufficient documentation

## 2023-10-08 DIAGNOSIS — R293 Abnormal posture: Secondary | ICD-10-CM | POA: Diagnosis present

## 2023-10-08 DIAGNOSIS — G8929 Other chronic pain: Secondary | ICD-10-CM | POA: Diagnosis present

## 2023-10-08 DIAGNOSIS — M25512 Pain in left shoulder: Secondary | ICD-10-CM | POA: Diagnosis present

## 2023-10-08 DIAGNOSIS — M6281 Muscle weakness (generalized): Secondary | ICD-10-CM | POA: Diagnosis present

## 2023-10-08 NOTE — Therapy (Signed)
 OUTPATIENT PHYSICAL THERAPY SHOULDER EVALUATION   Patient Name: Carrie Greer MRN: [id number] DOB:06-26-1971, 53 y.o., female Today's Date: 10/08/2023, female Today's Date: 10/08/2023  END OF SESSION:  PT End of Session - 10/08/23 0936     Visit Number 1    Number of Visits 12    Date for PT Re-Evaluation 11/19/23    Authorization Type Medicaid [facility]    PT Start Time [phone]    PT Stop Time 1015    PT Time Calculation (min) 39 min    Activity Tolerance Patient tolerated treatment well             Past Medical History:  Diagnosis Date   Gestational diabetes 2009   Hypertension    Past Surgical History:  Procedure Laterality Date   CESAREAN SECTION  2009   triplets   TUBAL LIGATION  2009   Patient Active Problem List   Diagnosis Date Noted   Essential hypertension 06/24/2014    PCP: Shade Flood, MD  REFERRING PROVIDER: Madelyn Brunner, DO  REFERRING DIAG: [phone] (ICD-10-CM) - Calcific tendinitis of left shoulder M25.512,G89.29 (ICD-10-CM) - Chronic left shoulder pain  THERAPY DIAG:  Chronic left shoulder pain  Abnormal posture  Muscle weakness (generalized)  Rationale for Evaluation and Treatment: Rehabilitation  ONSET DATE: Off/on for 5 years  SUBJECTIVE:                                                                                                                                                                                      SUBJECTIVE STATEMENT: Pt reports she got an MRI of her shoulder and found a little tear. Pt reports pain with moving in a certain way. Pt states she has pain with reaching behind her. Pain is on the side of her shoulder. Pain can extend into the armpit. Pt states the shoulder gets tired quick. Not able to lay on her L shoulder.  Hand dominance: Left  PERTINENT HISTORY: N/a  PAIN:  Are you having pain? Yes: NPRS scale: 1 or 2 at rest; 8 at worst  Pain location: side of shoulder Pain description: Can "spasm" or "jump" with certain  movements Aggravating factors: reaching behind her Relieving factors: heating pad, propping up the arm  PRECAUTIONS: None  RED FLAGS: None   WEIGHT BEARING RESTRICTIONS: No  FALLS:  Has patient fallen in last 6 months? No  LIVING ENVIRONMENT: Lives with: lives with their family 4 daughters Lives in: House/apartment  OCCUPATION: Part time -- mostly sitting   PLOF: Independent  PATIENT GOALS: Improve shoulder pain with all activities  NEXT MD VISIT:   OBJECTIVE:  Note: Objective measures were completed at Evaluation unless otherwise noted.  DIAGNOSTIC FINDINGS:  MRI 08/04/23 IMPRESSION: 1. Mild calcific tendinosis of the anterior aspect of the supraspinatus insertion. 2. Mild infraspinatus tendinosis. 3. Mild subacromial/subdeltoid bursitis.  PATIENT SURVEYS:  QuickDASH Score: 31.8 / 100 = 31.8 %  COGNITION: Overall cognitive status: Within functional limits for tasks assessed     SENSATION: WFL -- will get some N/T with certain movements  POSTURE: Rounded shoulders, increased thoracic kyphosis    UPPER EXTREMITY ROM:   Active ROM Right eval Left eval  Shoulder flexion 165 140 pain  Shoulder extension 65 65  Shoulder abduction 160 110 pain  Shoulder adduction    Shoulder internal rotation To T4 To T4 pain  Shoulder external rotation 60 45 pull  Elbow flexion    Elbow extension    Wrist flexion    Wrist extension    Wrist ulnar deviation    Wrist radial deviation    Wrist pronation    Wrist supination    (Blank rows = not tested)  UPPER EXTREMITY MMT:  MMT Right eval Left eval  Shoulder flexion 5 5  Shoulder extension 4+ 3+  Shoulder abduction 5 4+  Shoulder adduction    Shoulder internal rotation 5 5  Shoulder external rotation 5 5  Middle trapezius 4 3+  Lower trapezius 4 3-  Elbow flexion    Elbow extension    Wrist flexion    Wrist extension    Wrist ulnar deviation    Wrist radial deviation    Wrist pronation    Wrist  supination    Grip strength (lbs)    (Blank rows = not tested)  SHOULDER SPECIAL TESTS: Impingement tests: Painful arc test: positive  SLAP lesions: Biceps load test: negative Instability tests: Apprehension test: negative Rotator cuff assessment: Full can test: positive  and Infraspinatus test: positive   JOINT MOBILITY TESTING:  WNL  PALPATION:  Multiple trigger points and tenderness to palpation R UT, infraspinatus, rhomboids, subscap, teres minor/major                                                                                                                           TREATMENT DATE:  10/08/23 See HEP below  PATIENT EDUCATION: Education details: Exam findings, POC, initial HEP Person educated: Patient Education method: Explanation, Demonstration, and Handouts Education comprehension: verbalized understanding, returned demonstration, and needs further education  HOME EXERCISE PROGRAM: Access Code: [email or web address] URL: [email or web address] Date: 10/08/2023 Prepared by: Vernon Prey April Kirstie Peri  Exercises - Standing Upper Trapezius Mobilization with Small Ball  - 1 x daily - 7 x weekly - 2 sets - 10 reps - Latissimus Dorsi Stretch at Wall (Mirrored)  - 1 x daily - 7 x weekly - 2 sets - 30 sec hold - Doorway Pec Stretch at 60 Elevation  - 1 x daily - 7 x weekly - 2 sets - 30 sec hold - Shoulder extension with resistance - Neutral  - 1 x daily - 7 x weekly - 2 sets -  10 reps - Shoulder External Rotation and Scapular Retraction with Resistance  - 1 x daily - 7 x weekly - 2 sets - 10 reps  ASSESSMENT:  CLINICAL IMPRESSION: Patient is a 53 y.o. F who was seen today for physical therapy evaluation and treatment for L shoulder pain. Pt is L hand dominant. Has had good relief from injection. MRI found tendinosis in supraspinatus and infraspinatus as well as subacromial/subdeltoid bursitis. Assessment is significant for muscular tightness and trigger points about  infraspinatus, subscap, rhomboids, teres minor/major/lats, and upper traps with scapular weakness likely leading to rotator cuff over use. Pt will highly benefit from PT to improve on these deficits to reduce rotator cuff over compensation for improved and pain free L arm mobility.  OBJECTIVE IMPAIRMENTS: decreased activity tolerance, decreased coordination, decreased endurance, decreased mobility, decreased ROM, decreased strength, increased fascial restrictions, increased muscle spasms, impaired flexibility, impaired UE functional use, improper body mechanics, postural dysfunction, and pain.   ACTIVITY LIMITATIONS: lifting, sleeping, bathing, toileting, dressing, reach over head, hygiene/grooming, and caring for others  PARTICIPATION LIMITATIONS: meal prep, cleaning, laundry, driving, shopping, community activity, and yard work  PERSONAL FACTORS: Age, Fitness, Past/current experiences, and Time since onset of injury/illness/exacerbation are also affecting patient's functional outcome.   REHAB POTENTIAL: Good  CLINICAL DECISION MAKING: Evolving/moderate complexity  EVALUATION COMPLEXITY: Moderate   GOALS: Goals reviewed with patient? Yes  SHORT TERM GOALS: Target date: 10/29/2023   Pt will be ind with initial HEP Baseline: Goal status: INITIAL  2.  Pt will demo L = R shoulder AROM without pain Baseline:  Goal status: INITIAL   LONG TERM GOALS: Target date: 11/19/2023   Pt will be ind with management and progression of HEP Baseline:  Goal status: INITIAL  2.  Pt will be able to reach behind her back for washing/bathing/dressing without any pain Baseline:  Goal status: INITIAL  3.  Pt will have improved QuickDASH to </=21.8% to demo MCID Baseline:  Goal status: INITIAL  4.  Pt will report >/=50% improvement in her overall muscle tightness/trigger points Baseline:  Goal status: INITIAL  5.  Pt will be able to lift overhead at least 5# with no pain Baseline:  Goal  status: INITIAL  PLAN:  PT FREQUENCY: 2x/week  PT DURATION: 6 weeks  PLANNED INTERVENTIONS: 97164- PT Re-evaluation, 97110-Therapeutic exercises, 97530- Therapeutic activity, [id number]- Neuromuscular re-education, 97535- Self Care, 16010- Manual therapy, [id number]- Electrical stimulation (unattended), 97016- Vasopneumatic device, [id number]- Ultrasound, 93235- Ionotophoresis 4mg /ml Dexamethasone, Patient/Family education, Taping, Dry Needling, Joint mobilization, Spinal mobilization, Cryotherapy, and Moist heat  PLAN FOR NEXT SESSION: Assess response to HEP. Manual work for posterior shoulder and trigger points. Strengthen posterior shoulder without compensation. Eccentrics to address tendinosis.    Khiree Bukhari April Ma L Virgle Arth, PT, DPT 10/08/2023, 12:15 PM

## 2023-10-09 ENCOUNTER — Ambulatory Visit (INDEPENDENT_AMBULATORY_CARE_PROVIDER_SITE_OTHER): Payer: 59 | Admitting: Sports Medicine

## 2023-10-09 ENCOUNTER — Other Ambulatory Visit: Payer: Self-pay

## 2023-10-09 ENCOUNTER — Encounter: Payer: Self-pay | Admitting: Sports Medicine

## 2023-10-09 DIAGNOSIS — M7532 Calcific tendinitis of left shoulder: Secondary | ICD-10-CM

## 2023-10-09 DIAGNOSIS — G8929 Other chronic pain: Secondary | ICD-10-CM | POA: Diagnosis not present

## 2023-10-09 DIAGNOSIS — M19012 Primary osteoarthritis, left shoulder: Secondary | ICD-10-CM | POA: Diagnosis not present

## 2023-10-09 DIAGNOSIS — M25512 Pain in left shoulder: Secondary | ICD-10-CM

## 2023-10-09 MED ORDER — BUPIVACAINE HCL 0.25 % IJ SOLN
2.0000 mL | INTRAMUSCULAR | Status: AC | PRN
  Administered 2023-10-09: 2 mL via INTRA_ARTICULAR

## 2023-10-09 MED ORDER — LIDOCAINE HCL 1 % IJ SOLN
2.0000 mL | INTRAMUSCULAR | Status: AC | PRN
  Administered 2023-10-09: 2 mL

## 2023-10-09 MED ORDER — METHYLPREDNISOLONE ACETATE 40 MG/ML IJ SUSP
40.0000 mg | INTRAMUSCULAR | Status: AC | PRN
  Administered 2023-10-09: 40 mg via INTRA_ARTICULAR

## 2023-10-09 NOTE — Progress Notes (Signed)
 Carrie Greer - 53 y.o. female MRN [id number]  Date of birth: 1971-04-04  Office Visit Note: Visit Date: 10/09/2023 PCP: Shade Flood, MD Referred by: Shade Flood, MD  Subjective: Chief Complaint  Patient presents with   Left Shoulder - Pain, Follow-up   HPI: Carrie Greer is a pleasant 53 y.o. female who presents today for follow-up of chronic left shoulder pain with calcific tendinitis of the supraspinatus tendon.  She did have her first physical therapy evaluation and treatment yesterday.  Felt this was beneficial but having some soreness from activating muscles and tendons about the shoulder.  She has pain that radiates down the lateral shoulder, although feels some pain deeper in the shoulder as well.  She is not taking any medication for this.  She is interested in proceeding with injection with breaking up of her calcification.  Pertinent ROS were reviewed with the patient and found to be negative unless otherwise specified above in HPI.   Assessment & Plan: Visit Diagnoses:  1. Calcific tendinitis of left shoulder   2. Chronic left shoulder pain   3. Primary osteoarthritis, left shoulder    Plan: Impression is acute on chronic left shoulder pain with calcific tendinitis within the supraspinatus tendon which I feel is most bothersome in terms of her pain.  She does have some rotator cuff tendinosis but no high-grade tearing nor abnormality about the cartilage.  Through shared decision making, we did proceed with ultrasound-guided left shoulder injection both into the calcification at the supraspinatus as well as into the subacromial joint, patient tolerated well.  I did discuss that given the needle fenestrations for the calcification, she should expect some soreness over the next couple days.  She may use ice and/or Tylenol for any postinjection pain.  Advised on 48 to 72 hours of modified rest/activity, she may then resume her formalized physical therapy  next week on Mondays and Tuesdays.  We will follow-up if not significantly improved after this injection and physical therapy in about 6 weeks.  Follow-up: Return in about 6 weeks (around 11/20/2023), or if symptoms worsen or fail to improve.   Meds & Orders: No orders of the defined types were placed in this encounter.   Orders Placed This Encounter  Procedures   Large Joint Inj   US Guided Needle Placement - No Linked Charges     Procedures: Large Joint Inj: L subacromial bursa on 10/09/2023 4:02 PM Indications: pain Details: 22 G 1.5 in needle, ultrasound-guided lateral approach Medications: 2 mL lidocaine 1 %; 2 mL bupivacaine 0.25 %; 40 mg methylPREDNISolone acetate 40 MG/ML Outcome: tolerated well, no immediate complications  US-Guided Supraspinatus calcific tendon and Subacromial Injection, Left Shoulder  After discussion on risks/benefits/indications, informed verbal consent was obtained. A timeout was then performed. Patient was seated on table in exam room. The patient's shoulder was prepped with chloraprep and alcohol swabs and utilizing lateral approach with ultrasound guidance, the patient's calcific change within supraspinatus tendon was injected with multiple needle fenestrations and then the remainder of the subacromial space was injected with 2:2:1 mixture of lidocaine:bupivicaine:depomedrol via an in-plane approach. Patient tolerated the procedure well without immediate complications.   Procedure, treatment alternatives, risks and benefits explained, specific risks discussed. Consent was given by the patient. Immediately prior to procedure a time out was called to verify the correct patient, procedure, equipment, support staff and site/side marked as required. Patient was prepped and draped in the usual sterile fashion.  Clinical History: No specialty comments available.  She reports that she has never smoked. She has never used smokeless tobacco.  Recent Labs     12/18/22 1204 05/14/23 1043  HGBA1C 6.1 6.5    Objective:   Vital Signs: LMP 04/30/2018 (Exact Date)   Physical Exam  Gen: Well-appearing, in no acute distress; non-toxic CV: Well-perfused. Warm.  Resp: Breathing unlabored on room air; no wheezing. Psych: Fluid speech in conversation; appropriate affect; normal thought process  Ortho Exam - Left shoulder: + TTP around Codman's point.  There is full active range of motion about the shoulder although some pain with abduction and endrange forward flexion.  No redness swelling or effusion about the shoulder joint.  Imaging:  MR Shoulder Left w/ contrast CLINICAL DATA:  Left shoulder pain, limited range of motion  EXAM: MRI OF THE LEFT SHOULDER WITH CONTRAST  TECHNIQUE: Multiplanar, multisequence MR imaging of the left shoulder was performed following the administration of intra-articular contrast.  CONTRAST:  See Injection Documentation.  COMPARISON:  None Available.  FINDINGS: Rotator cuff: Mild mineralization of the anterior aspect of the supraspinatus insertion consistent with mild calcific tendinosis. Mild infraspinatus tendinosis. Teres minor tendon is intact. Subscapularis tendon is intact.  Muscles: No muscle atrophy or edema. No intramuscular fluid collection or hematoma.  Biceps Long Head: Intraarticular and extraarticular portions of the biceps tendon are intact.  Acromioclavicular Joint: Mild arthropathy of the acromioclavicular joint. Small amount of subacromial/subdeltoid bursal fluid.  Glenohumeral Joint: Intraarticular contrast distending the joint capsule. Normal glenohumeral ligaments. No chondral defect.  Labrum: Intact.  Bones: No fracture or dislocation. No aggressive osseous lesion.  Other: No fluid collection or hematoma.  IMPRESSION: 1. Mild calcific tendinosis of the anterior aspect of the supraspinatus insertion. 2. Mild infraspinatus tendinosis. 3. Mild subacromial/subdeltoid  bursitis.  Electronically Signed   By: Elige Ko M.D.   On: 08/04/2023 09:18  Past Medical/Family/Surgical/Social History: Medications & Allergies reviewed per EMR, new medications updated. Patient Active Problem List   Diagnosis Date Noted   Essential hypertension 06/24/2014   Past Medical History:  Diagnosis Date   Gestational diabetes 2009   Hypertension    Family History  Problem Relation Age of Onset   Cancer Mother 88       37; removed a golf-ball sized tumor from behind her LEFT ear   Diabetes Mother        lifestyle controlled   Hypertension Mother    Hypertension Father    Colon cancer Neg Hx    Colon polyps Neg Hx    Esophageal cancer Neg Hx    Stomach cancer Neg Hx    Rectal cancer Neg Hx    Past Surgical History:  Procedure Laterality Date   CESAREAN SECTION  2009   triplets   TUBAL LIGATION  2009   Social History   Occupational History   Occupation: Childcare    Comment: Medical illustrator  Tobacco Use   Smoking status: Never   Smokeless tobacco: Never  Vaping Use   Vaping status: Never Used  Substance and Sexual Activity   Alcohol use: Yes    Alcohol/week: 0.0 - 3.0 standard drinks of alcohol    Comment: wine on the weekends, reports not weekly   Drug use: Never   Sexual activity: Yes    Partners: Male    Birth control/protection: Surgical, Post-menopausal    Comment: BTL 2009 after the birth of triplets, sexually active with monogamous partner

## 2023-10-15 ENCOUNTER — Ambulatory Visit: Admitting: Physical Therapy

## 2023-10-15 DIAGNOSIS — M25512 Pain in left shoulder: Secondary | ICD-10-CM | POA: Diagnosis not present

## 2023-10-15 DIAGNOSIS — M6281 Muscle weakness (generalized): Secondary | ICD-10-CM

## 2023-10-15 DIAGNOSIS — G8929 Other chronic pain: Secondary | ICD-10-CM

## 2023-10-15 DIAGNOSIS — R293 Abnormal posture: Secondary | ICD-10-CM

## 2023-10-15 NOTE — Therapy (Signed)
 OUTPATIENT PHYSICAL THERAPY SHOULDER TREATMENT   Patient Name: Carrie Greer MRN: [id number] DOB:[birth year], 53 y.o., female Today's Date: 10/15/2023  END OF SESSION:  PT End of Session - 10/15/23 1103     Visit Number 2    Number of Visits 12    Date for PT Re-Evaluation 11/19/23    Authorization Type Medicaid [employer]    PT Start Time 1102    PT Stop Time 1141    PT Time Calculation (min) 39 min    Activity Tolerance Patient tolerated treatment well    Behavior During Therapy [facility] for tasks assessed/performed              Past Medical History:  Diagnosis Date   Gestational diabetes 2009   Hypertension    Past Surgical History:  Procedure Laterality Date   CESAREAN SECTION  2009   triplets   TUBAL LIGATION  2009   Patient Active Problem List   Diagnosis Date Noted   Essential hypertension 06/24/2014    PCP: Shade Flood, MD  REFERRING PROVIDER: Madelyn Brunner, DO  REFERRING DIAG: [phone] (ICD-10-CM) - Calcific tendinitis of left shoulder M25.512,G89.29 (ICD-10-CM) - Chronic left shoulder pain  THERAPY DIAG:  Chronic left shoulder pain  Abnormal posture  Muscle weakness (generalized)  Rationale for Evaluation and Treatment: Rehabilitation  ONSET DATE: Off/on for 5 years  SUBJECTIVE:                                                                                                                                                                                      SUBJECTIVE STATEMENT: Pt reports that she had a cortisone shot on Wed to her L shoulder. Pt states that now her pain is more "mellow" and she can move the arm more. Pt reports that she can reach behind her back and can lift the arm overhead but her muscles get tired quickly and start to burn. Pt continues to massage the area due to inflammation.  Pt wanting to reduce frequency to 1x/week due to copay, adjusted schedule accordingly.  Hand dominance: Left  PERTINENT  HISTORY: N/a  PAIN:  Are you having pain? Yes: NPRS scale: 3/10, 5/10 after using it all day  Pain location: side of shoulder Pain description: Can "spasm" or "jump" with certain movements Aggravating factors: reaching behind her Relieving factors: heating pad, propping up the arm  PRECAUTIONS: None  RED FLAGS: None   WEIGHT BEARING RESTRICTIONS: No  FALLS:  Has patient fallen in last 6 months? No  LIVING ENVIRONMENT: Lives with: lives with their family 4 daughters Lives in: House/apartment  OCCUPATION: Part time -- mostly sitting   PLOF: Independent  PATIENT GOALS: Improve shoulder pain with all activities  NEXT MD VISIT:   OBJECTIVE:  Note: Objective measures were completed at Evaluation unless otherwise noted.  DIAGNOSTIC FINDINGS:  MRI 08/04/23 IMPRESSION: 1. Mild calcific tendinosis of the anterior aspect of the supraspinatus insertion. 2. Mild infraspinatus tendinosis. 3. Mild subacromial/subdeltoid bursitis.  PATIENT SURVEYS:  QuickDASH Score: 31.8 / 100 = 31.8 %  COGNITION: Overall cognitive status: Within functional limits for tasks assessed     SENSATION: WFL -- will get some N/T with certain movements  POSTURE: Rounded shoulders, increased thoracic kyphosis    UPPER EXTREMITY ROM:   Active ROM Right eval Left eval  Shoulder flexion 165 140 pain  Shoulder extension 65 65  Shoulder abduction 160 110 pain  Shoulder adduction    Shoulder internal rotation To T4 To T4 pain  Shoulder external rotation 60 45 pull  Elbow flexion    Elbow extension    Wrist flexion    Wrist extension    Wrist ulnar deviation    Wrist radial deviation    Wrist pronation    Wrist supination    (Blank rows = not tested)  UPPER EXTREMITY MMT:  MMT Right eval Left eval  Shoulder flexion 5 5  Shoulder extension 4+ 3+  Shoulder abduction 5 4+  Shoulder adduction    Shoulder internal rotation 5 5  Shoulder external rotation 5 5  Middle trapezius 4 3+   Lower trapezius 4 3-  Elbow flexion    Elbow extension    Wrist flexion    Wrist extension    Wrist ulnar deviation    Wrist radial deviation    Wrist pronation    Wrist supination    Grip strength (lbs)    (Blank rows = not tested)  SHOULDER SPECIAL TESTS: Impingement tests: Painful arc test: positive  SLAP lesions: Biceps load test: negative Instability tests: Apprehension test: negative Rotator cuff assessment: Full can test: positive  and Infraspinatus test: positive   JOINT MOBILITY TESTING:  WNL  PALPATION:  Multiple trigger points and tenderness to palpation R UT, infraspinatus, rhomboids, subscap, teres minor/major                                                                                                                           TREATMENT:  TherEx Supine shoulder AROM for strengthening: Shoulder flexion x 10 reps Starts to feel pull along superior portion of shoulder Shoulder abduction x 10 reps  Supine shoulder PROM Shoulder IR x 10 reps Shoulder ER x 10 reps More pain coming back to neutral Palpable trigger points and tightness in her lats and teres major/minor, discussed TPDN  Seated and supine shoulder AAROM with dowel ER x 10 reps Abduction x 10 reps Flexion x 10 reps  Added to HEP, see bolded below  TherAct For brief assessment of AROM following cortisone shot last week: Seated AROM: Shoulder flexion WFL Shoulder abduction WFL Prone AROM Shoulder flexion WFL Has pain in ant  portion of L shoulder Shoulder horiz abd WFL No pain Shoulder ext limited ROM Pain in post delt and deep in armpit  PATIENT EDUCATION: Education details: added to HEP, encouraged her to hold off on resistance exercises until next visit and then can re-assess; TPDN (handout provided) Person educated: Patient Education method: Explanation, Demonstration, and Handouts Education comprehension: verbalized understanding, returned demonstration, and needs further  education  HOME EXERCISE PROGRAM: Access Code: [email or web address] URL: [email or web address] Date: 10/08/2023 Prepared by: Vernon Prey April Kirstie Peri  Exercises - Standing Upper Trapezius Mobilization with Small Ball  - 1 x daily - 7 x weekly - 2 sets - 10 reps - Latissimus Dorsi Stretch at Wall (Mirrored)  - 1 x daily - 7 x weekly - 2 sets - 30 sec hold - Doorway Pec Stretch at 60 Elevation  - 1 x daily - 7 x weekly - 2 sets - 30 sec hold - Shoulder extension with resistance - Neutral  - 1 x daily - 7 x weekly - 2 sets - 10 reps - Shoulder External Rotation and Scapular Retraction with Resistance  - 1 x daily - 7 x weekly - 2 sets - 10 reps - Seated Shoulder External Rotation AAROM with Dowel  - 1 x daily - 7 x weekly - 3 sets - 10 reps - Seated Shoulder Abduction AAROM with Dowel  - 1 x daily - 7 x weekly - 3 sets - 10 reps - Supine Shoulder Flexion with Dowel  - 1 x daily - 7 x weekly - 3 sets - 10 reps   ASSESSMENT:  CLINICAL IMPRESSION: Emphasis of skilled PT session on briefly assessing AROM and PROM of L shoulder joint following cortisone injection last week. Pt with improved ROM of her L shoulder joint following injection as well as reduced pain, has avoided resistance exercises due to ongoing inflammation and pain. Session focus on PROM and AAROM to tolerance. Pt can benefit from TPDN to this area next visit to address trigger points as well as gradual return to resistance exercises as tolerated. Continue POC.   OBJECTIVE IMPAIRMENTS: decreased activity tolerance, decreased coordination, decreased endurance, decreased mobility, decreased ROM, decreased strength, increased fascial restrictions, increased muscle spasms, impaired flexibility, impaired UE functional use, improper body mechanics, postural dysfunction, and pain.   ACTIVITY LIMITATIONS: lifting, sleeping, bathing, toileting, dressing, reach over head, hygiene/grooming, and caring for others  PARTICIPATION  LIMITATIONS: meal prep, cleaning, laundry, driving, shopping, community activity, and yard work  PERSONAL FACTORS: Age, Fitness, Past/current experiences, and Time since onset of injury/illness/exacerbation are also affecting patient's functional outcome.   REHAB POTENTIAL: Good  CLINICAL DECISION MAKING: Evolving/moderate complexity  EVALUATION COMPLEXITY: Moderate   GOALS: Goals reviewed with patient? Yes  SHORT TERM GOALS: Target date: 10/29/2023   Pt will be ind with initial HEP Baseline: Goal status: INITIAL  2.  Pt will demo L = R shoulder AROM without pain Baseline:  Goal status: INITIAL   LONG TERM GOALS: Target date: 11/19/2023   Pt will be ind with management and progression of HEP Baseline:  Goal status: INITIAL  2.  Pt will be able to reach behind her back for washing/bathing/dressing without any pain Baseline:  Goal status: INITIAL  3.  Pt will have improved QuickDASH to </=21.8% to demo MCID Baseline:  Goal status: INITIAL  4.  Pt will report >/=50% improvement in her overall muscle tightness/trigger points Baseline:  Goal status: INITIAL  5.  Pt will be able to lift overhead at least 5# with  no pain Baseline:  Goal status: INITIAL  PLAN:  PT FREQUENCY: 2x/week  PT DURATION: 6 weeks  PLANNED INTERVENTIONS: 97164- PT Re-evaluation, 97110-Therapeutic exercises, 97530- Therapeutic activity, [id number]- Neuromuscular re-education, 97535- Self Care, 95621- Manual therapy, [id number]- Electrical stimulation (unattended), 97016- Vasopneumatic device, [id number]- Ultrasound, 30865- Ionotophoresis 4mg /ml Dexamethasone, Patient/Family education, Taping, Dry Needling, Joint mobilization, Spinal mobilization, Cryotherapy, and Moist heat  PLAN FOR NEXT SESSION: Assess response to HEP. Manual work for posterior shoulder and trigger points. Strengthen posterior shoulder without compensation. Eccentrics to address tendinosis. Review and add resistance exercises next visit;  initiate TPDN next visit   Peter Congo, PT Peter Congo, PT, DPT, CSRS  10/15/2023, 11:41 AM

## 2023-10-16 ENCOUNTER — Ambulatory Visit: Admitting: Physical Therapy

## 2023-10-22 ENCOUNTER — Ambulatory Visit: Admitting: Physical Therapy

## 2023-10-23 ENCOUNTER — Ambulatory Visit: Admitting: Physical Therapy

## 2023-10-29 ENCOUNTER — Telehealth: Payer: Self-pay | Admitting: Physical Therapy

## 2023-10-29 ENCOUNTER — Ambulatory Visit: Admitting: Physical Therapy

## 2023-10-29 NOTE — Telephone Encounter (Signed)
 Called and LVM regarding no show and cancels. Unclear how many cancels patient has had so requested patient to call back. First confirmed no show for patient. Reminded of no show policy.   Maryruth Eve, PT, DPT

## 2023-10-30 ENCOUNTER — Ambulatory Visit: Admitting: Physical Therapy

## 2023-11-05 ENCOUNTER — Ambulatory Visit: Attending: Sports Medicine | Admitting: Physical Therapy

## 2023-11-05 DIAGNOSIS — M25512 Pain in left shoulder: Secondary | ICD-10-CM | POA: Diagnosis present

## 2023-11-05 DIAGNOSIS — M6281 Muscle weakness (generalized): Secondary | ICD-10-CM | POA: Insufficient documentation

## 2023-11-05 DIAGNOSIS — G8929 Other chronic pain: Secondary | ICD-10-CM | POA: Diagnosis present

## 2023-11-05 DIAGNOSIS — R293 Abnormal posture: Secondary | ICD-10-CM | POA: Diagnosis present

## 2023-11-05 NOTE — Therapy (Signed)
 OUTPATIENT PHYSICAL THERAPY SHOULDER TREATMENT   Patient Name: Carrie Greer MRN: [id number] DOB:[birth year], 53 y.o., female Today's Date: 11/05/2023  END OF SESSION:  PT End of Session - 11/05/23 1101     Visit Number 3    Number of Visits 12    Date for PT Re-Evaluation 11/19/23    Authorization Type Medicaid [employer]    PT Start Time 1100    PT Stop Time 1138    PT Time Calculation (min) 38 min    Activity Tolerance Patient tolerated treatment well    Behavior During Therapy WFL for tasks assessed/performed               Past Medical History:  Diagnosis Date   Gestational diabetes 2009   Hypertension    Past Surgical History:  Procedure Laterality Date   CESAREAN SECTION  2009   triplets   TUBAL LIGATION  2009   Patient Active Problem List   Diagnosis Date Noted   Essential hypertension 06/24/2014    PCP: Shade Flood, MD  REFERRING PROVIDER: Madelyn Brunner, DO  REFERRING DIAG: [phone] (ICD-10-CM) - Calcific tendinitis of left shoulder M25.512,G89.29 (ICD-10-CM) - Chronic left shoulder pain  THERAPY DIAG:  Chronic left shoulder pain  Abnormal posture  Muscle weakness (generalized)  Rationale for Evaluation and Treatment: Rehabilitation  ONSET DATE: Off/on for 5 years  SUBJECTIVE:                                                                                                                                                                                      SUBJECTIVE STATEMENT: Pt reports that she has been using "sweet ginger" rub on her L shoulder and it has really helped with her pain along with the steroid injection. Pt reports no pain right now, no pain with reaching overhead and out to the side.  Pt has been doing exercises prescribed last PT session, has not yet tried resistance exercises again yet. Pt reports that when she does the lat stretch at the wall she has to do it for less time than prescribed otherwise she starts to  feel a catch in the muscle.  Hand dominance: Left  PERTINENT HISTORY: N/a  PAIN:  Are you having pain? No  PRECAUTIONS: None  RED FLAGS: None   WEIGHT BEARING RESTRICTIONS: No  FALLS:  Has patient fallen in last 6 months? No  LIVING ENVIRONMENT: Lives with: lives with their family 4 daughters Lives in: House/apartment  OCCUPATION: Part time -- mostly sitting   PLOF: Independent  PATIENT GOALS: Improve shoulder pain with all activities  NEXT MD VISIT:   OBJECTIVE:  Note: Objective measures were  completed at Evaluation unless otherwise noted.  DIAGNOSTIC FINDINGS:  MRI 08/04/23 IMPRESSION: 1. Mild calcific tendinosis of the anterior aspect of the supraspinatus insertion. 2. Mild infraspinatus tendinosis. 3. Mild subacromial/subdeltoid bursitis.  PATIENT SURVEYS:  QuickDASH Score: 31.8 / 100 = 31.8 %  COGNITION: Overall cognitive status: Within functional limits for tasks assessed     SENSATION: WFL -- will get some N/T with certain movements  POSTURE: Rounded shoulders, increased thoracic kyphosis    UPPER EXTREMITY ROM:   Active ROM Right eval Left eval  Shoulder flexion 165 140 pain  Shoulder extension 65 65  Shoulder abduction 160 110 pain  Shoulder adduction    Shoulder internal rotation To T4 To T4 pain  Shoulder external rotation 60 45 pull  Elbow flexion    Elbow extension    Wrist flexion    Wrist extension    Wrist ulnar deviation    Wrist radial deviation    Wrist pronation    Wrist supination    (Blank rows = not tested)  UPPER EXTREMITY MMT:  MMT Right eval Left eval  Shoulder flexion 5 5  Shoulder extension 4+ 3+  Shoulder abduction 5 4+  Shoulder adduction    Shoulder internal rotation 5 5  Shoulder external rotation 5 5  Middle trapezius 4 3+  Lower trapezius 4 3-  Elbow flexion    Elbow extension    Wrist flexion    Wrist extension    Wrist ulnar deviation    Wrist radial deviation    Wrist pronation    Wrist  supination    Grip strength (lbs)    (Blank rows = not tested)  SHOULDER SPECIAL TESTS: Impingement tests: Painful arc test: positive  SLAP lesions: Biceps load test: negative Instability tests: Apprehension test: negative Rotator cuff assessment: Full can test: positive  and Infraspinatus test: positive   JOINT MOBILITY TESTING:  WNL  PALPATION:  Multiple trigger points and tenderness to palpation R UT, infraspinatus, rhomboids, subscap, teres minor/major                                                                                                                           TREATMENT:  TherEx Review/adjustment of HEP: Standing doorway pec stretch Pain in anterior shoulder joint with stretch Difficulty performing without max verbal and tactile cues Supine pec stretch Difficulty feeling stretch in this position, just feels pain in anterior L shoulder joint at site of muscle tear (?) Standing wall L pec stretch with body turn 3 x 60 sec Feels more of a stretch with this, added to HEP Educated patient that she can raise/lower her arm to target different areas of pec muscle depending on where her tightness is at Standing resisted shoulder extension with red TB Initially with max cueing to keep arms straight and to not extend arms past neutral Progresses to being able to perform exercise independently, can assess again next visit 2 x 10 reps Seated lat pulldown with red  TB 2 x 10 reps Added to HEP   Assessed B shoulder AROM grossly WFL and equal B EXCEPT for L shoulder IR mildly limited     PATIENT EDUCATION: Education details: adjusted HEP/continue HEP, PT POC with plan to possibly d/c early as her pain is improved pending independence with final HEP Person educated: Patient Education method: Explanation, Demonstration, and Handouts Education comprehension: verbalized understanding, returned demonstration, and needs further education  HOME EXERCISE PROGRAM: Access Code:  [email or web address] URL: [email or web address] Date: 10/08/2023 Prepared by: Vernon Prey April Kirstie Peri  Exercises - Standing Upper Trapezius Mobilization with Small Ball  - 1 x daily - 7 x weekly - 2 sets - 10 reps - Latissimus Dorsi Stretch at Wall (Mirrored)  - 1 x daily - 7 x weekly - 2 sets - 30 sec hold - Shoulder External Rotation and Scapular Retraction with Resistance  - 1 x daily - 7 x weekly - 2 sets - 10 reps - Seated Shoulder External Rotation AAROM with Dowel  - 1 x daily - 7 x weekly - 3 sets - 10 reps - Seated Shoulder Abduction AAROM with Dowel  - 1 x daily - 7 x weekly - 3 sets - 10 reps - Supine Shoulder Flexion with Dowel  - 1 x daily - 7 x weekly - 3 sets - 10 reps - Standing Pec Stretch at Wall  - 1 x daily - 7 x weekly - 1 sets - 3-5 reps - 30-60 sec hold - Seated High Lat Pull Down with Overhead Anchored Resistance  - 1 x daily - 7 x weekly - 3 sets - 10 reps - Shoulder extension with resistance - Neutral  - 1 x daily - 7 x weekly - 2 sets - 10 reps   ASSESSMENT:  CLINICAL IMPRESSION: Emphasis of skilled PT session on assessing STG and reviewing/modifying current HEP. Pt has met 1/2 STG due to being independent with her initial HEP. She does exhibit improved L shoulder AROM as compared to eval with L shoulder AROM = R shoulder AROM with no pain except with L shoulder IR. Pt does need up to max cueing initially to perform her HEP correctly, improves as session progresses. Pt continues to benefit from skilled PT services to work towards LTGs, continue to build a final HEP, and ensure independence with correct performance of her HEP. Continue POC.   OBJECTIVE IMPAIRMENTS: decreased activity tolerance, decreased coordination, decreased endurance, decreased mobility, decreased ROM, decreased strength, increased fascial restrictions, increased muscle spasms, impaired flexibility, impaired UE functional use, improper body mechanics, postural dysfunction, and pain.    ACTIVITY LIMITATIONS: lifting, sleeping, bathing, toileting, dressing, reach over head, hygiene/grooming, and caring for others  PARTICIPATION LIMITATIONS: meal prep, cleaning, laundry, driving, shopping, community activity, and yard work  PERSONAL FACTORS: Age, Fitness, Past/current experiences, and Time since onset of injury/illness/exacerbation are also affecting patient's functional outcome.   REHAB POTENTIAL: Good  CLINICAL DECISION MAKING: Evolving/moderate complexity  EVALUATION COMPLEXITY: Moderate   GOALS: Goals reviewed with patient? Yes  SHORT TERM GOALS: Target date: 10/29/2023   Pt will be ind with initial HEP Baseline: Goal status: MET  2.  Pt will demo L = R shoulder AROM without pain Baseline: slightly limited L shoulder IR with mild pain Goal status: IN PROGRESS   LONG TERM GOALS: Target date: 11/19/2023   Pt will be ind with management and progression of HEP Baseline:  Goal status: INITIAL  2.  Pt will be able to reach behind her back for  washing/bathing/dressing without any pain Baseline:  Goal status: INITIAL  3.  Pt will have improved QuickDASH to </=21.8% to demo MCID Baseline:  Goal status: INITIAL  4.  Pt will report >/=50% improvement in her overall muscle tightness/trigger points Baseline:  Goal status: INITIAL  5.  Pt will be able to lift overhead at least 5# with no pain Baseline:  Goal status: INITIAL  PLAN:  PT FREQUENCY: 2x/week  PT DURATION: 6 weeks  PLANNED INTERVENTIONS: 97164- PT Re-evaluation, 97110-Therapeutic exercises, 97530- Therapeutic activity, [id number]- Neuromuscular re-education, 97535- Self Care, 16109- Manual therapy, G0283- Electrical stimulation (unattended), 97016- Vasopneumatic device, [id number]- Ultrasound, 60454- Ionotophoresis 4mg /ml Dexamethasone, Patient/Family education, Taping, Dry Needling, Joint mobilization, Spinal mobilization, Cryotherapy, and Moist heat  PLAN FOR NEXT SESSION: Assess response to HEP.  Manual work for posterior shoulder and trigger points. Strengthen posterior shoulder without compensation. Eccentrics to address tendinosis. TPDN? chin tucks/cervical retract, L shoulder IR  Discussed PT POC and possibility of d/c after 2 visits if pain is still controlled after building her a good HEP, assess shoulder ext exercise from HEP   Peter Congo, PT Peter Congo, PT, DPT, CSRS  11/05/2023, 11:40 AM

## 2023-11-06 ENCOUNTER — Ambulatory Visit: Admitting: Physical Therapy

## 2023-11-12 ENCOUNTER — Ambulatory Visit: Admitting: Physical Therapy

## 2023-11-13 ENCOUNTER — Ambulatory Visit: Admitting: Physical Therapy

## 2023-11-19 ENCOUNTER — Ambulatory Visit: Admitting: Physical Therapy

## 2023-11-19 DIAGNOSIS — G8929 Other chronic pain: Secondary | ICD-10-CM

## 2023-11-19 DIAGNOSIS — M25512 Pain in left shoulder: Secondary | ICD-10-CM | POA: Diagnosis not present

## 2023-11-19 DIAGNOSIS — M6281 Muscle weakness (generalized): Secondary | ICD-10-CM

## 2023-11-19 DIAGNOSIS — R293 Abnormal posture: Secondary | ICD-10-CM

## 2023-11-19 NOTE — Therapy (Signed)
 OUTPATIENT PHYSICAL THERAPY SHOULDER TREATMENT - RECERTIFICATION   Patient Name: Carrie Greer MRN: [id number] DOB:1970-10-20, 53 y.o., female Today's Date: 11/19/2023  END OF SESSION:  PT End of Session - 11/19/23 1101     Visit Number 4    Number of Visits 12    Date for PT Re-Evaluation 12/17/23   recert   Authorization Type Medicaid [employer]    PT Start Time 1100    PT Stop Time 1132   pt done with treatment, to avoid flaring up pain   PT Time Calculation (min) 32 min    Activity Tolerance Patient tolerated treatment well    Behavior During Therapy [facility] for tasks assessed/performed                Past Medical History:  Diagnosis Date   Gestational diabetes 2009   Hypertension    Past Surgical History:  Procedure Laterality Date   CESAREAN SECTION  2009   triplets   TUBAL LIGATION  2009   Patient Active Problem List   Diagnosis Date Noted   Essential hypertension 06/24/2014    PCP: Benjiman Bras, MD  REFERRING PROVIDER: Shauna Del, DO  REFERRING DIAG: [phone] (ICD-10-CM) - Calcific tendinitis of left shoulder M25.512,G89.29 (ICD-10-CM) - Chronic left shoulder pain  THERAPY DIAG:  Chronic left shoulder pain  Abnormal posture  Muscle weakness (generalized)  Rationale for Evaluation and Treatment: Rehabilitation  ONSET DATE: Off/on for 5 years  SUBJECTIVE:                                                                                                                                                                                      SUBJECTIVE STATEMENT: Pt reports things are going better with her pain, not 8-10 but more 4-5 overall. Pt reports that her pain does depend on what she is doing during the day, she tries to use both UE vs just relying on her dominant hand. Pt reports that her pain can be flared up by trying to reach too far up behind her back or when reaching overhead and trying to get something with weight to it, then her  arm feels weak.  Pt feeling ok with her exercises after review last visit. No pain at rest today.   Hand dominance: Left  PERTINENT HISTORY: N/a  PAIN:  Are you having pain? No  PRECAUTIONS: None  RED FLAGS: None   WEIGHT BEARING RESTRICTIONS: No  FALLS:  Has patient fallen in last 6 months? No  LIVING ENVIRONMENT: Lives with: lives with their family 4 daughters Lives in: House/apartment  OCCUPATION: Part time -- mostly sitting   PLOF: Independent  PATIENT  GOALS: Improve shoulder pain with all activities  NEXT MD VISIT:   OBJECTIVE:  Note: Objective measures were completed at Evaluation unless otherwise noted.  DIAGNOSTIC FINDINGS:  MRI 08/04/23 IMPRESSION: 1. Mild calcific tendinosis of the anterior aspect of the supraspinatus insertion. 2. Mild infraspinatus tendinosis. 3. Mild subacromial/subdeltoid bursitis.  PATIENT SURVEYS:  QuickDASH Score: 31.8 / 100 = 31.8 %  COGNITION: Overall cognitive status: Within functional limits for tasks assessed     SENSATION: WFL -- will get some N/T with certain movements  POSTURE: Rounded shoulders, increased thoracic kyphosis    UPPER EXTREMITY ROM:   Active ROM Right eval Left eval  Shoulder flexion 165 140 pain  Shoulder extension 65 65  Shoulder abduction 160 110 pain  Shoulder adduction    Shoulder internal rotation To T4 To T4 pain  Shoulder external rotation 60 45 pull  Elbow flexion    Elbow extension    Wrist flexion    Wrist extension    Wrist ulnar deviation    Wrist radial deviation    Wrist pronation    Wrist supination    (Blank rows = not tested)  UPPER EXTREMITY MMT:  MMT Right eval Left eval  Shoulder flexion 5 5  Shoulder extension 4+ 3+  Shoulder abduction 5 4+  Shoulder adduction    Shoulder internal rotation 5 5  Shoulder external rotation 5 5  Middle trapezius 4 3+  Lower trapezius 4 3-  Elbow flexion    Elbow extension    Wrist flexion    Wrist extension    Wrist  ulnar deviation    Wrist radial deviation    Wrist pronation    Wrist supination    Grip strength (lbs)    (Blank rows = not tested)  SHOULDER SPECIAL TESTS: Impingement tests: Painful arc test: positive  SLAP lesions: Biceps load test: negative Instability tests: Apprehension test: negative Rotator cuff assessment: Full can test: positive  and Infraspinatus test: positive   JOINT MOBILITY TESTING:  WNL  PALPATION:  Multiple trigger points and tenderness to palpation R UT, infraspinatus, rhomboids, subscap, teres minor/major                                                                                                                           TREATMENT:  TherEx Seated B shoulder strengthening exercises with 5# dumbbells: OH press x 5-6 reps Shoulder flexion x 4-5 reps Shoulder abduction x 4-5 reps Shoulder scaption x 4-5 reps  Seated AAROM dowel rod exercises to address limited L IR: Behind the back dowel raise x 5 reps Behind the back IR stretch with dowel x 3 reps  Added to HEP, see bolded below   TherAct For LTG assessment: Pt able to reach behind her back with no increase in pain Pt reports her pain has improved by 50-55% quickDASH: 20.45% 5# lift OH: no increase in pain x 5 reps   PATIENT EDUCATION: Education details: adjusted HEP/continue HEP, results of assessments  this date - improvements overall in function Person educated: Patient Education method: Explanation, Demonstration, and Handouts Education comprehension: verbalized understanding, returned demonstration, and needs further education  HOME EXERCISE PROGRAM: Access Code: [email or web address] URL: [email or web address] Date: 10/08/2023 Prepared by: Gellen April Erman Hayward  Exercises - Standing Upper Trapezius Mobilization with Small Ball  - 1 x daily - 7 x weekly - 2 sets - 10 reps - Latissimus Dorsi Stretch at Wall (Mirrored)  - 1 x daily - 7 x weekly - 2 sets - 30 sec hold - Shoulder  External Rotation and Scapular Retraction with Resistance  - 1 x daily - 7 x weekly - 2 sets - 10 reps - Seated Shoulder External Rotation AAROM with Dowel  - 1 x daily - 7 x weekly - 3 sets - 10 reps - Seated Shoulder Abduction AAROM with Dowel  - 1 x daily - 7 x weekly - 3 sets - 10 reps - Supine Shoulder Flexion with Dowel  - 1 x daily - 7 x weekly - 3 sets - 10 reps - Standing Pec Stretch at Wall  - 1 x daily - 7 x weekly - 1 sets - 3-5 reps - 30-60 sec hold - Seated High Lat Pull Down with Overhead Anchored Resistance  - 1 x daily - 7 x weekly - 3 sets - 10 reps - Shoulder extension with resistance - Neutral  - 1 x daily - 7 x weekly - 2 sets - 10 reps - Shoulder Overhead Press in Flexion with Dumbbells  - 1 x daily - 7 x weekly - 3 sets - 5-6 reps - Standing Shoulder Flexion to 90 Degrees with Dumbbells  - 1 x daily - 7 x weekly - 3 sets - 4-5 reps - Scaption with Dumbbells  - 1 x daily - 7 x weekly - 3 sets - 4-5 reps - Shoulder Abduction with Dumbbells - Thumbs Up  - 1 x daily - 7 x weekly - 3 sets - 4-5 reps - Standing Bilateral Shoulder Internal Rotation AAROM with Dowel  - 1 x daily - 7 x weekly - 3 sets - 10 reps - Standing Shoulder Internal Rotation AAROM with Dowel  - 1 x daily - 7 x weekly - 3 sets - 10 reps   ASSESSMENT:  CLINICAL IMPRESSION: Emphasis of skilled PT session on assessing LTG and trialing various shoulder strengthening exercises to add to HEP. Pt has met 5/5 LTG assessed this date due to being independent with management and performance of her current HEP, demonstrating improved function with ability to reach behind her back with no increase in pain and being able to lift a 5# weight OH with no increase in pain. She also self reports a 50-55% improvement in her symptoms from where she started at and demonstrates improved function and decreased pain based on her improvement in her score on the QuickDASH from 31% impaired to about 21% impairment this date. Pt has shown  significant improvement from her initial evaluation. She continues to benefit from skilled PT services to ensure correct performance and compliance with her HEP and continuing building strength and stability in her shoulder joints in order to decrease her pain and improve her function. Continue POC.   OBJECTIVE IMPAIRMENTS: decreased activity tolerance, decreased coordination, decreased endurance, decreased mobility, decreased ROM, decreased strength, increased fascial restrictions, increased muscle spasms, impaired flexibility, impaired UE functional use, improper body mechanics, postural dysfunction, and pain.   ACTIVITY LIMITATIONS: lifting, sleeping, bathing, toileting, dressing, reach over  head, hygiene/grooming, and caring for others  PARTICIPATION LIMITATIONS: meal prep, cleaning, laundry, driving, shopping, community activity, and yard work  PERSONAL FACTORS: Age, Fitness, Past/current experiences, and Time since onset of injury/illness/exacerbation are also affecting patient's functional outcome.   REHAB POTENTIAL: Good  CLINICAL DECISION MAKING: Evolving/moderate complexity  EVALUATION COMPLEXITY: Moderate   GOALS: Goals reviewed with patient? Yes  SHORT TERM GOALS: Target date: 10/29/2023   Pt will be ind with initial HEP Baseline: Goal status: MET  2.  Pt will demo L = R shoulder AROM without pain Baseline: slightly limited L shoulder IR with mild pain Goal status: IN PROGRESS   LONG TERM GOALS: Target date: 11/19/2023   Pt will be ind with management and progression of HEP Baseline:  Goal status: MET  2.  Pt will be able to reach behind her back for washing/bathing/dressing without any pain Baseline:  Goal status: MET  3.  Pt will have improved QuickDASH to </=21.8% to demo MCID Baseline: 31.8% (eval), 20.45% (4/21) Goal status: MET  4.  Pt will report >/=50% improvement in her overall muscle tightness/trigger points Baseline: 50-55% (4/21) Goal status:  MET  5.  Pt will be able to lift overhead at least 5# with no pain Baseline:  Goal status: MET  NEW SHORT TERM GOALS=LONG TERM GOALS due to length of POC   NEW LONG TERM GOALS:  Target date: 12/17/2023  Pt will be independent with final HEP for improved strength and management of her pain symptoms. Baseline: independent with HEP as of 4/21 Goal status: IN PROGRESS  2.  Pt will demonstrate pain </= 2/10 for improved function Baseline: 8-9/10 (eval), 4-5/10 (4/21) Goal status: INITIAL  3.  Pt will have improved QuickDASH to </=16% to demo MCID Baseline: 31.8% (eval), 20.45% (4/21) Goal status: IN PROGRESS  4.  Pt will demonstrate ability to reach into cabinets overhead to retreive kitchen items with no increase in pain. Baseline:  Goal status: INITIAL    PLAN:  PT FREQUENCY: 2x/week  PT DURATION: 6 weeks + 4 weeks (recert)  PLANNED INTERVENTIONS: 16109- PT Re-evaluation, 97110-Therapeutic exercises, 97530- Therapeutic activity, [id number]- Neuromuscular re-education, 97535- Self Care, 60454- Manual therapy, G0283- Electrical stimulation (unattended), 97016- Vasopneumatic device, [id number]- Ultrasound, 09811- Ionotophoresis 4mg /ml Dexamethasone, Patient/Family education, Taping, Dry Needling, Joint mobilization, Spinal mobilization, Cryotherapy, and Moist heat  PLAN FOR NEXT SESSION: Assess response to HEP. Manual work for posterior shoulder and trigger points. Strengthen posterior shoulder without compensation. Eccentrics to address tendinosis. chin tucks/cervical retract, L shoulder IR  Pt will call at end of week if she wants to d/c  For all possible CPT codes, reference the Planned Interventions line above.     Check all conditions that are expected to impact treatment: {Conditions expected to impact treatment:None of these apply   If treatment provided at initial evaluation, no treatment charged due to lack of authorization.        Genora Arp, PT Lorita Rosa, PT,  DPT, CSRS  11/19/2023, 11:32 AM

## 2023-11-26 ENCOUNTER — Ambulatory Visit: Admitting: Physical Therapy

## 2023-11-26 ENCOUNTER — Encounter: Payer: Self-pay | Admitting: Physical Therapy

## 2023-11-26 NOTE — Therapy (Signed)
 [facility] [address] [place], [place], [address] Phone: [phone]   Fax:  [phone]  Patient Details  Name: Carrie Greer MRN: [id number] Date of Birth: 13-Aug-1970 Referring Provider:  No ref. provider found  Encounter Date: 11/26/2023   Patient called clinic wishing to be d/c from PT due to improvement in symptoms as previously discussed during last PT visit. Plan to d/c from PT and patient will need a new referral to return to PT in the future.   Lorita Rosa, PT Lorita Rosa, PT, DPT, CSRS  11/26/2023, 9:38 AM  [facility] [address] [place], [place], [address] Phone: [phone]   Fax:  [phone]

## 2023-11-27 ENCOUNTER — Ambulatory Visit: Admitting: Physical Therapy

## 2023-12-03 ENCOUNTER — Ambulatory Visit: Admitting: Physical Therapy

## 2023-12-04 ENCOUNTER — Ambulatory Visit: Admitting: Physical Therapy

## 2023-12-10 ENCOUNTER — Ambulatory Visit: Admitting: Physical Therapy

## 2023-12-11 ENCOUNTER — Ambulatory Visit: Admitting: Physical Therapy

## 2024-01-15 ENCOUNTER — Other Ambulatory Visit: Payer: Self-pay | Admitting: Family Medicine

## 2024-01-15 DIAGNOSIS — I10 Essential (primary) hypertension: Secondary | ICD-10-CM

## 2024-01-18 ENCOUNTER — Other Ambulatory Visit: Payer: Self-pay | Admitting: Family Medicine

## 2024-01-18 DIAGNOSIS — I10 Essential (primary) hypertension: Secondary | ICD-10-CM

## 2024-02-12 ENCOUNTER — Other Ambulatory Visit: Payer: Self-pay | Admitting: Family Medicine

## 2024-02-12 DIAGNOSIS — I10 Essential (primary) hypertension: Secondary | ICD-10-CM

## 2024-06-02 ENCOUNTER — Encounter: Payer: Self-pay | Admitting: Radiology

## 2024-06-03 ENCOUNTER — Other Ambulatory Visit: Payer: Self-pay | Admitting: Family Medicine

## 2024-06-03 DIAGNOSIS — I10 Essential (primary) hypertension: Secondary | ICD-10-CM

## 2024-07-05 ENCOUNTER — Other Ambulatory Visit: Payer: Self-pay | Admitting: Family Medicine

## 2024-07-05 DIAGNOSIS — I10 Essential (primary) hypertension: Secondary | ICD-10-CM

## 2024-08-06 ENCOUNTER — Other Ambulatory Visit: Payer: Self-pay | Admitting: Family Medicine

## 2024-08-06 DIAGNOSIS — I10 Essential (primary) hypertension: Secondary | ICD-10-CM

## 2024-08-06 NOTE — Telephone Encounter (Signed)
 Copied from CRM [id number]. Topic: Clinical - Medication Refill >> Aug 06, 2024 11:51 AM Alexandria E wrote: Medication: amLODipine  (NORVASC ) 5 MG tablet lisinopril -hydrochlorothiazide  (ZESTORETIC ) 20-25 MG tablet   Has the patient contacted their pharmacy? No (Agent: If no, request that the patient contact the pharmacy for the refill. If patient does not wish to contact the pharmacy document the reason why and proceed with request.) (Agent: If yes, when and what did the pharmacy advise?)  This is the patient's preferred pharmacy:  [facility] [address] ([address] [place] - [address] [place] ([place]) [place] [address] Phone: [phone] Fax: [phone]  Is this the correct pharmacy for this prescription? Yes If no, delete pharmacy and type the correct one.   Has the prescription been filled recently? No  Is the patient out of the medication? No  Has the patient been seen for an appointment in the last year OR does the patient have an upcoming appointment? No, but patient stated she will call back on Friday to make an appointment as the transmission on her car went out.  Can we respond through MyChart? Yes  Agent: Please be advised that Rx refills may take up to 3 business days. We ask that you follow-up with your pharmacy.

## 2024-09-02 ENCOUNTER — Other Ambulatory Visit: Payer: Self-pay | Admitting: Family Medicine

## 2024-09-02 DIAGNOSIS — I10 Essential (primary) hypertension: Secondary | ICD-10-CM
